# Patient Record
Sex: Female | Born: 1965 | Race: White | Hispanic: No | Marital: Married | Smoking: Former smoker
Health system: Southern US, Community
[De-identification: ages and names within clinical notes are randomized; demographics above are authoritative.]

## PROBLEM LIST (undated history)

## (undated) DIAGNOSIS — E039 Hypothyroidism, unspecified: Secondary | ICD-10-CM

## (undated) DIAGNOSIS — F988 Other specified behavioral and emotional disorders with onset usually occurring in childhood and adolescence: Secondary | ICD-10-CM

## (undated) DIAGNOSIS — C801 Malignant (primary) neoplasm, unspecified: Secondary | ICD-10-CM

## (undated) DIAGNOSIS — N289 Disorder of kidney and ureter, unspecified: Secondary | ICD-10-CM

## (undated) HISTORY — PX: ABDOMINAL HYSTERECTOMY: SHX81

## (undated) HISTORY — PX: ABDOMINOPLASTY: SUR9

## (undated) HISTORY — PX: BREAST LUMPECTOMY: SHX2

---

## 2013-03-02 ENCOUNTER — Observation Stay (HOSPITAL_COMMUNITY): Payer: Managed Care, Other (non HMO)

## 2013-03-02 ENCOUNTER — Observation Stay (HOSPITAL_COMMUNITY): Payer: Managed Care, Other (non HMO) | Admitting: Anesthesiology

## 2013-03-02 ENCOUNTER — Encounter (HOSPITAL_COMMUNITY)
Admission: EM | Disposition: A | Discharge status: Home / Self Care | Payer: Self-pay | Source: Home / Self Care | Attending: Emergency Medicine

## 2013-03-02 ENCOUNTER — Observation Stay (HOSPITAL_COMMUNITY)
Admission: EM | Admit: 2013-03-02 | Discharge: 2013-03-03 | Disposition: A | Discharge status: Home / Self Care | Payer: Managed Care, Other (non HMO) | Attending: General Surgery | Admitting: General Surgery

## 2013-03-02 ENCOUNTER — Emergency Department (HOSPITAL_COMMUNITY): Payer: Managed Care, Other (non HMO)

## 2013-03-02 ENCOUNTER — Encounter (HOSPITAL_COMMUNITY): Payer: Self-pay | Admitting: Anesthesiology

## 2013-03-02 ENCOUNTER — Encounter (HOSPITAL_COMMUNITY): Payer: Self-pay | Admitting: *Deleted

## 2013-03-02 DIAGNOSIS — Z79899 Other long term (current) drug therapy: Secondary | ICD-10-CM | POA: Insufficient documentation

## 2013-03-02 DIAGNOSIS — K352 Acute appendicitis with generalized peritonitis, without abscess: Secondary | ICD-10-CM | POA: Insufficient documentation

## 2013-03-02 DIAGNOSIS — K353 Acute appendicitis with localized peritonitis, without perforation or gangrene: Secondary | ICD-10-CM

## 2013-03-02 DIAGNOSIS — K35209 Acute appendicitis with generalized peritonitis, without abscess, unspecified as to perforation: Principal | ICD-10-CM | POA: Insufficient documentation

## 2013-03-02 DIAGNOSIS — K3580 Unspecified acute appendicitis: Secondary | ICD-10-CM

## 2013-03-02 DIAGNOSIS — C50919 Malignant neoplasm of unspecified site of unspecified female breast: Secondary | ICD-10-CM

## 2013-03-02 DIAGNOSIS — R109 Unspecified abdominal pain: Secondary | ICD-10-CM | POA: Insufficient documentation

## 2013-03-02 HISTORY — DX: Malignant (primary) neoplasm, unspecified: C80.1

## 2013-03-02 HISTORY — PX: LAPAROSCOPIC APPENDECTOMY: SHX408

## 2013-03-02 HISTORY — DX: Disorder of kidney and ureter, unspecified: N28.9

## 2013-03-02 LAB — URINALYSIS, ROUTINE W REFLEX MICROSCOPIC
Bilirubin Urine: NEGATIVE
Glucose, UA: NEGATIVE mg/dL
Hgb urine dipstick: NEGATIVE
Ketones, ur: NEGATIVE mg/dL
Leukocytes, UA: NEGATIVE
Nitrite: NEGATIVE
Protein, ur: NEGATIVE mg/dL
Specific Gravity, Urine: 1.012 (ref 1.005–1.030)
Urobilinogen, UA: 0.2 mg/dL (ref 0.0–1.0)
pH: 7.5 (ref 5.0–8.0)

## 2013-03-02 LAB — CBC WITH DIFFERENTIAL/PLATELET
Basophils Absolute: 0 10*3/uL (ref 0.0–0.1)
Basophils Relative: 0 % (ref 0–1)
HCT: 36.9 % (ref 36.0–46.0)
Lymphocytes Relative: 12 % (ref 12–46)
MCHC: 32.5 g/dL (ref 30.0–36.0)
Neutro Abs: 7.4 10*3/uL (ref 1.7–7.7)
Neutrophils Relative %: 78 % — ABNORMAL HIGH (ref 43–77)
Platelets: 266 10*3/uL (ref 150–400)
RDW: 13.6 % (ref 11.5–15.5)
WBC: 9.5 10*3/uL (ref 4.0–10.5)

## 2013-03-02 LAB — COMPREHENSIVE METABOLIC PANEL
ALT: 11 U/L (ref 0–35)
AST: 16 U/L (ref 0–37)
Albumin: 3.6 g/dL (ref 3.5–5.2)
CO2: 31 mEq/L (ref 19–32)
Chloride: 101 mEq/L (ref 96–112)
Creatinine, Ser: 0.84 mg/dL (ref 0.50–1.10)
GFR calc non Af Amer: 83 mL/min — ABNORMAL LOW (ref >90)
Potassium: 3.4 mEq/L — ABNORMAL LOW (ref 3.5–5.1)
Sodium: 138 mEq/L (ref 135–145)
Total Bilirubin: 0.3 mg/dL (ref 0.3–1.2)

## 2013-03-02 SURGERY — APPENDECTOMY, LAPAROSCOPIC
Anesthesia: General | Wound class: Clean Contaminated

## 2013-03-02 MED ORDER — ACETAMINOPHEN 10 MG/ML IV SOLN
1000.0000 mg | Freq: Four times a day (QID) | INTRAVENOUS | Status: DC
  Administered 2013-03-02: 1000 mg via INTRAVENOUS

## 2013-03-02 MED ORDER — MIDAZOLAM HCL 5 MG/5ML IJ SOLN
INTRAMUSCULAR | Status: DC | PRN
  Administered 2013-03-02 (×3): 1 mg via INTRAVENOUS

## 2013-03-02 MED ORDER — LIDOCAINE HCL (CARDIAC) 20 MG/ML IV SOLN
INTRAVENOUS | Status: DC | PRN
  Administered 2013-03-02: 50 mg via INTRAVENOUS

## 2013-03-02 MED ORDER — FENTANYL CITRATE 0.05 MG/ML IJ SOLN: 25.0000 ug | INTRAMUSCULAR | Status: DC | PRN

## 2013-03-02 MED ORDER — ROCURONIUM BROMIDE 100 MG/10ML IV SOLN
INTRAVENOUS | Status: DC | PRN
  Administered 2013-03-02: 5 mg via INTRAVENOUS
  Administered 2013-03-02 (×2): 15 mg via INTRAVENOUS
  Administered 2013-03-02: 5 mg via INTRAVENOUS

## 2013-03-02 MED ORDER — SUCCINYLCHOLINE CHLORIDE 20 MG/ML IJ SOLN
INTRAMUSCULAR | Status: DC | PRN
  Administered 2013-03-02: 100 mg via INTRAVENOUS

## 2013-03-02 MED ORDER — ACETAMINOPHEN 10 MG/ML IV SOLN
INTRAVENOUS | Status: AC
  Filled 2013-03-02: qty 100

## 2013-03-02 MED ORDER — 0.9 % SODIUM CHLORIDE (POUR BTL) OPTIME
TOPICAL | Status: DC | PRN
  Administered 2013-03-02: 1000 mL

## 2013-03-02 MED ORDER — FENTANYL CITRATE 0.05 MG/ML IJ SOLN
INTRAMUSCULAR | Status: DC | PRN
  Administered 2013-03-02: 100 ug via INTRAVENOUS
  Administered 2013-03-02 (×3): 50 ug via INTRAVENOUS

## 2013-03-02 MED ORDER — HYDROMORPHONE HCL PF 1 MG/ML IJ SOLN
0.5000 mg | Freq: Once | INTRAMUSCULAR | Status: AC
  Administered 2013-03-02: 0.5 mg via INTRAVENOUS
  Filled 2013-03-02: qty 1

## 2013-03-02 MED ORDER — LACTATED RINGERS IV SOLN: INTRAVENOUS | Status: DC

## 2013-03-02 MED ORDER — HYDROCODONE-ACETAMINOPHEN 5-325 MG PO TABS
1.0000 | ORAL_TABLET | ORAL | Status: DC | PRN
  Administered 2013-03-03 (×2): 1 via ORAL
  Filled 2013-03-02 (×2): qty 1

## 2013-03-02 MED ORDER — GLYCOPYRROLATE 0.2 MG/ML IJ SOLN
INTRAMUSCULAR | Status: DC | PRN
  Administered 2013-03-02: 0.3 mg via INTRAVENOUS
  Administered 2013-03-02: 0.1 mg via INTRAVENOUS

## 2013-03-02 MED ORDER — AMPHETAMINE-DEXTROAMPHETAMINE 10 MG PO TABS: 15.0000 mg | ORAL_TABLET | Freq: Two times a day (BID) | ORAL | Status: DC

## 2013-03-02 MED ORDER — LIOTHYRONINE SODIUM 5 MCG PO TABS
5.0000 ug | ORAL_TABLET | Freq: Every day | ORAL | Status: DC
  Filled 2013-03-02: qty 1

## 2013-03-02 MED ORDER — OMEGA-3-ACID ETHYL ESTERS 1 G PO CAPS
2.0000 g | ORAL_CAPSULE | Freq: Every day | ORAL | Status: DC
  Filled 2013-03-02: qty 2

## 2013-03-02 MED ORDER — ONDANSETRON HCL 4 MG/2ML IJ SOLN
INTRAMUSCULAR | Status: DC | PRN
  Administered 2013-03-02: 4 mg via INTRAVENOUS

## 2013-03-02 MED ORDER — MORPHINE SULFATE 2 MG/ML IJ SOLN: 1.0000 mg | INTRAMUSCULAR | Status: DC | PRN

## 2013-03-02 MED ORDER — HYDROMORPHONE HCL PF 1 MG/ML IJ SOLN
1.0000 mg | INTRAMUSCULAR | Status: DC | PRN
  Filled 2013-03-02: qty 1

## 2013-03-02 MED ORDER — POTASSIUM CHLORIDE IN NACL 20-0.9 MEQ/L-% IV SOLN
INTRAVENOUS | Status: DC
  Administered 2013-03-02: 13:00:00 via INTRAVENOUS
  Administered 2013-03-03: 100 mL via INTRAVENOUS
  Filled 2013-03-02 (×3): qty 1000

## 2013-03-02 MED ORDER — IOHEXOL 300 MG/ML  SOLN
80.0000 mL | Freq: Once | INTRAMUSCULAR | Status: AC | PRN
  Administered 2013-03-02: 80 mL via INTRAVENOUS

## 2013-03-02 MED ORDER — HEPARIN SODIUM (PORCINE) 5000 UNIT/ML IJ SOLN
5000.0000 [IU] | Freq: Three times a day (TID) | INTRAMUSCULAR | Status: DC
Start: 2013-03-03 — End: 2013-03-03
  Filled 2013-03-02 (×4): qty 1

## 2013-03-02 MED ORDER — NEOSTIGMINE METHYLSULFATE 1 MG/ML IJ SOLN
INTRAMUSCULAR | Status: DC | PRN
  Administered 2013-03-02: 2 mg via INTRAVENOUS
  Administered 2013-03-02: 1 mg via INTRAVENOUS

## 2013-03-02 MED ORDER — POTASSIUM CHLORIDE IN NACL 20-0.9 MEQ/L-% IV SOLN
INTRAVENOUS | Status: AC
  Filled 2013-03-02: qty 1000

## 2013-03-02 MED ORDER — ONDANSETRON HCL 4 MG/2ML IJ SOLN
4.0000 mg | Freq: Four times a day (QID) | INTRAMUSCULAR | Status: DC | PRN
  Administered 2013-03-02: 4 mg via INTRAVENOUS
  Filled 2013-03-02: qty 2

## 2013-03-02 MED ORDER — SUMATRIPTAN SUCCINATE 100 MG PO TABS
100.0000 mg | ORAL_TABLET | ORAL | Status: DC | PRN
  Administered 2013-03-02: 100 mg via ORAL
  Filled 2013-03-02 (×2): qty 1

## 2013-03-02 MED ORDER — BUPIVACAINE-EPINEPHRINE 0.25% -1:200000 IJ SOLN
INTRAMUSCULAR | Status: DC | PRN
  Administered 2013-03-02: 20 mL

## 2013-03-02 MED ORDER — PROPOFOL 10 MG/ML IV BOLUS
INTRAVENOUS | Status: DC | PRN
  Administered 2013-03-02: 150 mg via INTRAVENOUS

## 2013-03-02 MED ORDER — DEXAMETHASONE SODIUM PHOSPHATE 10 MG/ML IJ SOLN
INTRAMUSCULAR | Status: DC | PRN
  Administered 2013-03-02: 4 mg via INTRAVENOUS

## 2013-03-02 MED ORDER — ONDANSETRON HCL 4 MG/2ML IJ SOLN
4.0000 mg | Freq: Once | INTRAMUSCULAR | Status: AC
  Administered 2013-03-02: 4 mg via INTRAVENOUS
  Filled 2013-03-02: qty 2

## 2013-03-02 MED ORDER — DEXTROSE 5 % IV SOLN
2.0000 g | Freq: Once | INTRAVENOUS | Status: AC
  Administered 2013-03-02: 2 g via INTRAVENOUS
  Filled 2013-03-02: qty 2

## 2013-03-02 MED ORDER — LACTATED RINGERS IR SOLN
Status: DC | PRN
  Administered 2013-03-02: 1000 mL

## 2013-03-02 MED ORDER — ONDANSETRON HCL 4 MG PO TABS: 4.0000 mg | ORAL_TABLET | Freq: Four times a day (QID) | ORAL | Status: DC | PRN

## 2013-03-02 MED ORDER — ONDANSETRON HCL 4 MG/2ML IJ SOLN
4.0000 mg | INTRAMUSCULAR | Status: DC | PRN
  Administered 2013-03-02: 4 mg via INTRAVENOUS

## 2013-03-02 MED ORDER — METOCLOPRAMIDE HCL 5 MG/ML IJ SOLN
INTRAMUSCULAR | Status: DC | PRN
  Administered 2013-03-02: 10 mg via INTRAVENOUS

## 2013-03-02 MED ORDER — PHENYLEPHRINE HCL 10 MG/ML IJ SOLN
INTRAMUSCULAR | Status: DC | PRN
  Administered 2013-03-02: 80 ug via INTRAVENOUS

## 2013-03-02 MED ORDER — LACTATED RINGERS IV SOLN
INTRAVENOUS | Status: DC
  Administered 2013-03-02: 11:00:00 via INTRAVENOUS
  Administered 2013-03-02: 1000 mL via INTRAVENOUS

## 2013-03-02 MED ORDER — BUPIVACAINE-EPINEPHRINE PF 0.25-1:200000 % IJ SOLN
INTRAMUSCULAR | Status: AC
  Filled 2013-03-02: qty 30

## 2013-03-02 MED ORDER — IOHEXOL 300 MG/ML  SOLN
50.0000 mL | Freq: Once | INTRAMUSCULAR | Status: AC | PRN
  Administered 2013-03-02: 50 mL via ORAL

## 2013-03-02 MED ORDER — DEXTROSE 5 % IV SOLN
1.0000 g | Freq: Four times a day (QID) | INTRAVENOUS | Status: AC
  Administered 2013-03-02 – 2013-03-03 (×3): 1 g via INTRAVENOUS
  Filled 2013-03-02 (×4): qty 1

## 2013-03-02 SURGICAL SUPPLY — 35 items
APPLIER CLIP ROT 10 11.4 M/L (STAPLE)
BENZOIN TINCTURE PRP APPL 2/3 (GAUZE/BANDAGES/DRESSINGS) ×2 IMPLANT
CANISTER SUCTION 2500CC (MISCELLANEOUS) ×2 IMPLANT
CLIP APPLIE ROT 10 11.4 M/L (STAPLE) IMPLANT
CLOTH BEACON ORANGE TIMEOUT ST (SAFETY) ×2 IMPLANT
CUTTER FLEX LINEAR 45M (STAPLE) IMPLANT
DECANTER SPIKE VIAL GLASS SM (MISCELLANEOUS) ×2 IMPLANT
DRAPE LAPAROSCOPIC ABDOMINAL (DRAPES) ×2 IMPLANT
ELECT REM PT RETURN 9FT ADLT (ELECTROSURGICAL) ×2
ELECTRODE REM PT RTRN 9FT ADLT (ELECTROSURGICAL) ×1 IMPLANT
ENDOLOOP SUT PDS II  0 18 (SUTURE)
ENDOLOOP SUT PDS II 0 18 (SUTURE) IMPLANT
GLOVE BIOGEL PI IND STRL 7.0 (GLOVE) ×1 IMPLANT
GLOVE BIOGEL PI INDICATOR 7.0 (GLOVE) ×1
GLOVE EUDERMIC 7 POWDERFREE (GLOVE) ×2 IMPLANT
GOWN STRL NON-REIN LRG LVL3 (GOWN DISPOSABLE) ×2 IMPLANT
GOWN STRL REIN XL XLG (GOWN DISPOSABLE) ×4 IMPLANT
HAND ACTIVATED (MISCELLANEOUS) IMPLANT
KIT BASIN OR (CUSTOM PROCEDURE TRAY) ×2 IMPLANT
PENCIL BUTTON HOLSTER BLD 10FT (ELECTRODE) ×2 IMPLANT
POUCH SPECIMEN RETRIEVAL 10MM (ENDOMECHANICALS) ×2 IMPLANT
RELOAD 45 VASCULAR/THIN (ENDOMECHANICALS) ×2 IMPLANT
RELOAD STAPLE TA45 3.5 REG BLU (ENDOMECHANICALS) IMPLANT
SET IRRIG TUBING LAPAROSCOPIC (IRRIGATION / IRRIGATOR) ×2 IMPLANT
SOLUTION ANTI FOG 6CC (MISCELLANEOUS) ×2 IMPLANT
STRIP CLOSURE SKIN 1/2X4 (GAUZE/BANDAGES/DRESSINGS) ×2 IMPLANT
SUT MNCRL AB 4-0 PS2 18 (SUTURE) ×2 IMPLANT
SUT VICRYL 0 UR6 27IN ABS (SUTURE) ×2 IMPLANT
TOWEL OR 17X26 10 PK STRL BLUE (TOWEL DISPOSABLE) ×2 IMPLANT
TRAY FOLEY CATH 14FRSI W/METER (CATHETERS) ×2 IMPLANT
TRAY LAP CHOLE (CUSTOM PROCEDURE TRAY) ×2 IMPLANT
TROCAR BLADELESS OPT 5 75 (ENDOMECHANICALS) ×4 IMPLANT
TROCAR XCEL 12X100 BLDLESS (ENDOMECHANICALS) ×2 IMPLANT
TROCAR XCEL BLUNT TIP 100MML (ENDOMECHANICALS) ×2 IMPLANT
TUBING INSUFFLATION 10FT LAP (TUBING) ×2 IMPLANT

## 2013-03-02 NOTE — Anesthesia Preprocedure Evaluation (Addendum)
Anesthesia Evaluation  Patient identified by MRN, date of birth, ID band Patient awake    Reviewed: Allergy & Precautions, H&P , NPO status , Patient's Chart, lab work & pertinent test results  Airway Mallampati: II TM Distance: >3 FB Neck ROM: full    Dental no notable dental hx. (+) Teeth Intact and Dental Advisory Given   Pulmonary neg pulmonary ROS,  breath sounds clear to auscultation  Pulmonary exam normal       Cardiovascular Exercise Tolerance: Good negative cardio ROS  Rhythm:regular Rate:Normal     Neuro/Psych negative neurological ROS  negative psych ROS   GI/Hepatic negative GI ROS, Neg liver ROS,   Endo/Other  negative endocrine ROS  Renal/GU negative Renal ROS  negative genitourinary   Musculoskeletal   Abdominal   Peds  Hematology negative hematology ROS (+)   Anesthesia Other Findings   Reproductive/Obstetrics negative OB ROS                          Anesthesia Physical Anesthesia Plan  ASA: I and emergent  Anesthesia Plan: General   Post-op Pain Management:    Induction: Intravenous, Rapid sequence and Cricoid pressure planned  Airway Management Planned: Oral ETT  Additional Equipment:   Intra-op Plan:   Post-operative Plan: Extubation in OR  Informed Consent: I have reviewed the patients History and Physical, chart, labs and discussed the procedure including the risks, benefits and alternatives for the proposed anesthesia with the patient or authorized representative who has indicated his/her understanding and acceptance.   Dental Advisory Given  Plan Discussed with: CRNA  Anesthesia Plan Comments:         Anesthesia Quick Evaluation

## 2013-03-02 NOTE — ED Provider Notes (Signed)
History     CSN: [id number]  Arrival date & time 03/02/13  4098   First MD Initiated Contact with Patient 03/02/13 0258      Chief Complaint  Patient presents with  . Abdominal Pain   HPI Dali Kraner is a 47 y.o. female history of breast cancer previously on tamoxifen, she took herself off of that secondary to bladder pain.  Patient arrives today with about 2 days history of worsening abdominal pain. Patient's pain started as a "upset stomach" and she gestures to the Center of the abdomen, periumbilical region, and is now migrated to the right lower quadrant, it is sharp, worse on movement and bumps, associated nausea without vomiting. Patient has eaten today. No anorexia. No diarrhea. No dysuria, frequency, no vaginal discharge, no fevers, chills, chest pain, shortness of breath. Patient's pain is currently 9/10.  Past Medical History  Diagnosis Date  . Cancer     Breast  . Renal disorder     kidney stones, bladder problems    Past Surgical History  Procedure Laterality Date  . Breast lumpectomy      left breast  . Cesarean section    . Abdominal hysterectomy      No family history on file.  History  Substance Use Topics  . Smoking status: Former Games developer  . Smokeless tobacco: Not on file  . Alcohol Use: Yes     Comment: socially    OB History   Grav Para Term Preterm Abortions TAB SAB Ect Mult Living                  Review of Systems At least 10pt or greater review of systems completed and are negative except where specified in the HPI.  Allergies  Review of patient's allergies indicates no known allergies.  Home Medications   Current Outpatient Rx  Name  Route  Sig  Dispense  Refill  . amphetamine-dextroamphetamine (ADDERALL) 15 MG tablet   Oral   Take 15 mg by mouth 2 (two) times daily.         Marland Kitchen anastrozole (ARIMIDEX) 1 MG tablet   Oral   Take 1 mg by mouth daily.         . calcium carbonate (OS-CAL) 600 MG TABS   Oral   Take 1,200 mg by mouth  daily.         Marland Kitchen liothyronine (CYTOMEL) 5 MCG tablet   Oral   Take 5 mcg by mouth daily.         Marland Kitchen omega-3 acid ethyl esters (LOVAZA) 1 G capsule   Oral   Take 2 g by mouth daily.           BP 117/85  Pulse 90  Temp(Src) 98.9 F (37.2 C) (Oral)  Wt 109 lb 6 oz (49.612 kg)  SpO2 100%  Physical Exam  Nursing notes reviewed.  Electronic medical record reviewed. VITAL SIGNS:   Filed Vitals:   03/02/13 0240  BP: 117/85  Pulse: 90  Temp: 98.9 F (37.2 C)  TempSrc: Oral  Weight: 109 lb 6 oz (49.612 kg)  SpO2: 100%   CONSTITUTIONAL: Awake, oriented, appears non-toxic HENT: Atraumatic, normocephalic, oral mucosa pink and moist, airway patent. Nares patent without drainage. External ears normal. EYES: Conjunctiva clear, EOMI, PERRLA NECK: Trachea midline, non-tender, supple CARDIOVASCULAR: Normal heart rate, Normal rhythm, No murmurs, rubs, gallops PULMONARY/CHEST: Clear to auscultation, no rhonchi, wheezes, or rales. Symmetrical breath sounds. Non-tender. ABDOMINAL: Non-distended, soft, tenderness at McBurney's point with  voluntary guarding, & rebound. Patient has tenderness over the suprapubic region as well.  BS normal. Well-healed surgical scars of the abdomen. NEUROLOGIC: Non-focal, moving all four extremities, no gross sensory or motor deficits. EXTREMITIES: No clubbing, cyanosis, or edema SKIN: Warm, Dry, No erythema, No rash  ED Course  Procedures (including critical care time)  Labs Reviewed  CBC WITH DIFFERENTIAL - Abnormal; Notable for the following:    Neutrophils Relative 78 (*)    All other components within normal limits  COMPREHENSIVE METABOLIC PANEL - Abnormal; Notable for the following:    Potassium 3.4 (*)    GFR calc non Af Amer 83 (*)    All other components within normal limits  LIPASE, BLOOD  URINALYSIS, ROUTINE W REFLEX MICROSCOPIC   Ct Abdomen Pelvis W Contrast  03/02/2013  *RADIOLOGY REPORT*  Clinical Data: 47 year old female with  increasing lower abdominal pain and nausea.  History of kidney stones and bladder problems. History of breast cancer status post radiation and chemotherapy completed in 2013.  CT ABDOMEN AND PELVIS WITH CONTRAST  Technique:  Multidetector CT imaging of the abdomen and pelvis was performed following the standard protocol during bolus administration of intravenous contrast.  Contrast: 80mL OMNIPAQUE IOHEXOL 300 MG/ML  SOLN  Comparison: None.  Findings: Lung bases are clear except for minimal atelectasis.  No pericardial or pleural effusion.  No acute or suspicious osseous lesion identified.  Uterus is surgically absent.  No definite pelvic free fluid. Adnexa not delineated.  Mostly decompressed distal colon.  Some retained stool mixed with contrast or other dense enteric contents in the distal colon.  Left colon within normal limits.  Negative transverse colon except for some redundancy.  Retained stool the right colon and cecum.  The cecum is mostly located in the pelvis.  The appendix arises from the posterior cecum and is directed into the posterior right hemi pelvis.  There is mural thickening and enhancement.  There is hyperdense material in the appendix suspicious for appendicoliths. There is fluid in the appendix.  The appendix measures up to 11 mm diameter (8 mm near the tip).  Oral contrast has not yet reached the ileum.  There is some flocculated material in the distal small bowel.  No dilated small bowel.  Stomach is distended with contrast.  Duodenum is mildly distended.  No liver lesion identified.  Gallbladder, spleen, pancreas, adrenal glands, and portal venous system are within normal limits.  Major arterial structures in the abdomen and pelvis are patent with mild atherosclerosis.  No abdominal free fluid.  Punctate nonobstructing right renal lower pole calculus.  There may be a punctate left lower pole calculus.  Kidneys otherwise are within normal limits. No lymphadenopathy in the abdomen or pelvis.   IMPRESSION: 1.  Positive for acute appendicitis.  Dilated and hyperenhancing appendix extending into the posterior right hemi pelvis with evidence of appendicoliths in the lumen. 2.  No definite free fluid.  No abscess. 3.  No metastatic process identified in the abdomen or pelvis.   Original Report Authenticated By: Erskine Speed, M.D.      1. Acute appendicitis with localized peritonitis       MDM  Zelia Yzaguirre is a 47 y.o. female with a history of chronic bladder pain that had resolved when she stopped taking her tamoxifen presents with periumbilical pain has migrated to the right lower quadrant concerning for appendicitis. Labs and CT show a normal white count but the CT quite clearly shows appendicitis. Antibiotics started, discussed with  Dr. Charlean Sanfilippo who is coming on for the day Will evaluate patient at [facility] long for appendectomy.  Patient is stable for admission for acute appendicitis        Jones Skene, MD 03/02/13 0700

## 2013-03-02 NOTE — Interval H&P Note (Signed)
History and Physical Interval Note:  03/02/2013 9:21 AM  Sheila Whitney  has presented today for surgery, with the diagnosis of appendicitis  The goals and the various methods of treatment have been discussed with the patient and family. After consideration of risks, benefits and other options for treatment, the patient has consented to  Procedure(s): APPENDECTOMY LAPAROSCOPIC possible open appendectomy (N/A) as a surgical intervention .  The patient's history has been reviewed, patient examined, no change in status, stable for surgery.  I have reviewed the patient's chart and labs.  Questions were answered to the patient's satisfaction.     Ernestene Mention

## 2013-03-02 NOTE — H&P (Signed)
General surgery attending note:  I have personally interviewed and examined this patient this morning. I have reviewed her lab work and CT scan. I agree with the assessment and treatment plan outlined by Ms. Earl Gala, [place].  This patient has had abdominal pain for 24 hours, initially centralized now right lower quadrant. She's been nauseated but has not vomited. She has a history of endometriosis and has had an abdominal hysterectomy. She has also had an abdominoplasty. Previously she has had a cesarean section. She also has been treated for stage II breast cancer in [place] and is currently on antiestrogen therapy.  Examination reveals tenderness and guarding in the right lower quadrant. Has a little bit of fullness in this area but no mass. All of her scars appear well healed and there are no hernias detected.  Assessment: Acute appendicitis History cesarean section History abdominal hysterectomy  history abdominoplasty History stage II breast cancer, one year following initial diagnosis and treatment  Plan: The patient to the operating room for a laparoscopic appendectomy, possible open appendectomy.  I discussed the indications, details, techniques, and numerous risks of the surgery with her. She understands all these issues. All questions are answered. She agrees with this plan.   Angelia Mould. Derrell Lolling, M.D., [facility] General and Minimally invasive Surgery Breast and Colorectal Surgery Office:   [phone] Pager:   [phone]

## 2013-03-02 NOTE — Op Note (Signed)
Patient Name:           Sheila Whitney   Date of Surgery:        03/02/2013  Pre op Diagnosis:      Acute appendicitis  Post op Diagnosis:    Acute appendicitis, partially retrocecal  Procedure:                 Laparoscopic appendectomy  Surgeon:                     Angelia Mould. Derrell Lolling, M.D., FACS  Assistant:                      None  Operative Indications:   This patient has had abdominal pain for 24 hours, initially centralized now right lower quadrant. She's been nauseated but has not vomited. She has a history of endometriosis and has had an abdominal hysterectomy. She has also had an abdominoplasty. Previously she has had a cesarean section. She also has been treated for stage II breast cancer in [place] and is currently on antiestrogen therapy.  Examination reveals tenderness and guarding in the right lower quadrant. Has a little bit of fullness in this area but no mass. All of her scars appear well healed and there are no hernias detected. CT scan shows acute appendicitis with the appendix in the posterior right pelvis. There is no evidence of rupture or abscess.  Operative Findings:       The appendix was acutely inflamed but not ruptured. There was minimal exudate. The body and tip of the appendix were free in the pelvic peritoneal cavity, but the base of the appendix was somewhat retrocecal requiring a little bit of mobilization of the cecum from lateral to medial. The terminal ileum could be identified at. The ileocecal valve area could be identified. The liver looked healthy. The rest of the small bowel and colon looked normal.  Procedure in Detail:          Following the induction of general endotracheal anesthesia, a Foley catheter was inserted, the abdomen was prepped and draped in a sterile fashion, and intravenous antibiotics were completely infused. Surgical time out was performed. 0.5% Marcaine with epinephrine was used as a local infiltration anesthetic.      I made a curved  incision at the superior rim of the umbilicus, through a previous scar. A section was carried down to the fascia above the umbilicus. There were numerous permanent sutures there. I slowly divided the fascia in this location but found that the preperitoneal tissues were very thickened and so I became reluctant to forcibly puncture through this. I placed a 5 mm optical trocar in the left subcostal region. This was uneventful. Pneumoperitoneum was created. I could visualize under the umbilicus and there were no adhesions. I then completed the dissection through the supraumbilical fascia into the abdominal cavity and placed an 11 mm Hassan trocar. I secured this with 0 Vicryl sutures. Ultimately I placed a 5 mm trocar in the left lower quadrant and a 12 mm trocar in the left suprapubic area. I  identified the anatomy of the terminal ileum, ileocecal valve, cecum, and the appendix. We divided some of the lateral peritoneal attachments to mobilize the cecum fully.  We then lifted the appendix up. Using the harmonic scalpel we divided the appendiceal mesentery. We did this in several stages until we had completely mobilized the appendix and could see the insertion of the appendix into the  base of the cecum. Endo GIA stapler was inserted carefully across the base of the cecum taking a small cuff of the cecum. The stapler was closed and fired and removed. The staple line looked excellent without bleeding. There were a couple of loose staples which were retrieved and removed. The appendix is placed in a specimen bag and removed. We irrigated the operative field. There was no bleeding. We checked all the areas of dissection and there appeared to be no problem. We placed the omentum over the staple line. The pneumoperitoneum was released. The trocars were removed. The fascia above the umbilicus was closed with several interrupted sutures of 0 Vicryl. The fascia at the left suprapubic trocar site was closed with 0 Vicryl  sutures. Skin incisions were closed with subcuticular sutures of 4-0 Monocryl Steri-Strips. Clean bandages were placed the patient to return in stable condition. EBL 10 cc. Counts correct. Deno Lunger M. Derrell Lolling, M.D., FACS General and Minimally Invasive Surgery Breast and Colorectal Surgery  03/02/2013 11:57 AM

## 2013-03-02 NOTE — ED Notes (Signed)
Pt states that she began with rt lower quad abd pain that began yesterday and has progressively gotten worse; pt c/o nausea no vomiting.

## 2013-03-02 NOTE — H&P (Signed)
Sheila Whitney 1966/03/30  [id number].   Primary Oncologist: [facility] Chief Complaint/Reason for Consult: abdominal pain, acute appendicitis HPI: This is a 47 yo female who is in [place] for her son-in-law's graduation.  She developed some vague abdominal discomfort yesterday and felt she needed to have a BM which didn't relieve the pain.  She had some nausea, but no emesis.  The pain migrated to the lower portion of her abdomen, and eventually to the RLQ.  She did not check her temperature and denies chills.  Due to progressive pain, she presented to [facility] today for evaluation.  She was found to have acute appendicitis on CT scan.  Of note, she is still taking oral chemo, Arimidex.  Review of Systems: Please see HPI, otherwise all other systems are negative, except for hematuria  History reviewed. No pertinent family history.  Past Medical History  Diagnosis Date  . Cancer     Breast  . Renal disorder     kidney stones, bladder problems    Past Surgical History  Procedure Laterality Date  . Breast lumpectomy      left breast  . Cesarean section    . Abdominal hysterectomy    . Abdominoplasty      Social History:  reports that she has quit smoking. She does not have any smokeless tobacco history on file. She reports that she drinks about 1.2 ounces of alcohol per week. She reports that she does not use illicit drugs.  Allergies: No Known Allergies   (Not in a hospital admission)  Blood pressure 117/85, pulse 90, temperature 98.9 F (37.2 C), temperature source Oral, weight 109 lb 6 oz (49.612 kg), SpO2 100.00%. Physical Exam: General: pleasant, WD, WN white female who is laying in bed in NAD HEENT: head is normocephalic, atraumatic.  Sclera are noninjected.  PERRL.  Ears and nose without any masses or lesions.  Mouth is pink and moist Heart: regular, rate, and rhythm.  Normal s1,s2. No obvious murmurs, gallops, or rubs noted.  Palpable radial and pedal pulses bilaterally Lungs:  CTAB, no wheezes, rhonchi, or rales noted.  Respiratory effort nonlabored Abd: soft, diffusely tender, but greatest in RLQ, ND, +BS, no masses, hernias, or organomegaly.  She has several scars from prior abdominal surgery MS: all 4 extremities are symmetrical with no cyanosis, clubbing, or edema. Skin: warm and dry with no masses, lesions, or rashes Psych: A&Ox3 with an appropriate affect.    Results for orders placed during the hospital encounter of 03/02/13 (from the past 48 hour(s))  CBC WITH DIFFERENTIAL     Status: Abnormal   Collection Time    03/02/13  3:17 AM      Result Value Range   WBC 9.5  4.0 - 10.5 K/uL   RBC 4.12  3.87 - 5.11 MIL/uL   Hemoglobin 12.0  12.0 - 15.0 g/dL   HCT 40.9  81.1 - 91.4 %   MCV 89.6  78.0 - 100.0 fL   MCH 29.1  26.0 - 34.0 pg   MCHC 32.5  30.0 - 36.0 g/dL   RDW 78.2  95.6 - 21.3 %   Platelets 266  150 - 400 K/uL   Neutrophils Relative 78 (*) 43 - 77 %   Neutro Abs 7.4  1.7 - 7.7 K/uL   Lymphocytes Relative 12  12 - 46 %   Lymphs Abs 1.2  0.7 - 4.0 K/uL   Monocytes Relative 8  3 - 12 %   Monocytes Absolute 0.7  0.1 -  1.0 K/uL   Eosinophils Relative 2  0 - 5 %   Eosinophils Absolute 0.2  0.0 - 0.7 K/uL   Basophils Relative 0  0 - 1 %   Basophils Absolute 0.0  0.0 - 0.1 K/uL  COMPREHENSIVE METABOLIC PANEL     Status: Abnormal   Collection Time    03/02/13  3:17 AM      Result Value Range   Sodium 138  135 - 145 mEq/L   Potassium 3.4 (*) 3.5 - 5.1 mEq/L   Chloride 101  96 - 112 mEq/L   CO2 31  19 - 32 mEq/L   Glucose, Bld 96  70 - 99 mg/dL   BUN 14  6 - 23 mg/dL   Creatinine, Ser 4.54  0.50 - 1.10 mg/dL   Calcium 9.3  8.4 - 09.8 mg/dL   Total Protein 6.5  6.0 - 8.3 g/dL   Albumin 3.6  3.5 - 5.2 g/dL   AST 16  0 - 37 U/L   ALT 11  0 - 35 U/L   Alkaline Phosphatase 103  39 - 117 U/L   Total Bilirubin 0.3  0.3 - 1.2 mg/dL   GFR calc non Af Amer 83 (*) >90 mL/min   GFR calc Af Amer >90  >90 mL/min   Comment:            The eGFR has  been calculated     using the CKD EPI equation.     This calculation has not been     validated in all clinical     situations.     eGFR's persistently     <90 mL/min signify     possible Chronic Kidney Disease.  LIPASE, BLOOD     Status: None   Collection Time    03/02/13  3:17 AM      Result Value Range   Lipase 45  11 - 59 U/L  URINALYSIS, ROUTINE W REFLEX MICROSCOPIC     Status: None   Collection Time    03/02/13  4:39 AM      Result Value Range   Color, Urine YELLOW  YELLOW   APPearance CLEAR  CLEAR   Specific Gravity, Urine 1.012  1.005 - 1.030   pH 7.5  5.0 - 8.0   Glucose, UA NEGATIVE  NEGATIVE mg/dL   Hgb urine dipstick NEGATIVE  NEGATIVE   Bilirubin Urine NEGATIVE  NEGATIVE   Ketones, ur NEGATIVE  NEGATIVE mg/dL   Protein, ur NEGATIVE  NEGATIVE mg/dL   Urobilinogen, UA 0.2  0.0 - 1.0 mg/dL   Nitrite NEGATIVE  NEGATIVE   Leukocytes, UA NEGATIVE  NEGATIVE   Comment: MICROSCOPIC NOT DONE ON URINES WITH NEGATIVE PROTEIN, BLOOD, LEUKOCYTES, NITRITE, OR GLUCOSE <1000 mg/dL.   Ct Abdomen Pelvis W Contrast  03/02/2013  *RADIOLOGY REPORT*  Clinical Data: 47 year old female with increasing lower abdominal pain and nausea.  History of kidney stones and bladder problems. History of breast cancer status post radiation and chemotherapy completed in 2013.  CT ABDOMEN AND PELVIS WITH CONTRAST  Technique:  Multidetector CT imaging of the abdomen and pelvis was performed following the standard protocol during bolus administration of intravenous contrast.  Contrast: 80mL OMNIPAQUE IOHEXOL 300 MG/ML  SOLN  Comparison: None.  Findings: Lung bases are clear except for minimal atelectasis.  No pericardial or pleural effusion.  No acute or suspicious osseous lesion identified.  Uterus is surgically absent.  No definite pelvic free fluid. Adnexa not delineated.  Mostly  decompressed distal colon.  Some retained stool mixed with contrast or other dense enteric contents in the distal colon.  Left colon  within normal limits.  Negative transverse colon except for some redundancy.  Retained stool the right colon and cecum.  The cecum is mostly located in the pelvis.  The appendix arises from the posterior cecum and is directed into the posterior right hemi pelvis.  There is mural thickening and enhancement.  There is hyperdense material in the appendix suspicious for appendicoliths. There is fluid in the appendix.  The appendix measures up to 11 mm diameter (8 mm near the tip).  Oral contrast has not yet reached the ileum.  There is some flocculated material in the distal small bowel.  No dilated small bowel.  Stomach is distended with contrast.  Duodenum is mildly distended.  No liver lesion identified.  Gallbladder, spleen, pancreas, adrenal glands, and portal venous system are within normal limits.  Major arterial structures in the abdomen and pelvis are patent with mild atherosclerosis.  No abdominal free fluid.  Punctate nonobstructing right renal lower pole calculus.  There may be a punctate left lower pole calculus.  Kidneys otherwise are within normal limits. No lymphadenopathy in the abdomen or pelvis.  IMPRESSION: 1.  Positive for acute appendicitis.  Dilated and hyperenhancing appendix extending into the posterior right hemi pelvis with evidence of appendicoliths in the lumen. 2.  No definite free fluid.  No abscess. 3.  No metastatic process identified in the abdomen or pelvis.   Original Report Authenticated By: Erskine Speed, M.D.        Assessment/Plan 1. Acute appendicitis 2. Breast cancer, on oral chemo 3. Hematuria, thought secondary to prior tamoxifen use 4. H/o nephrolithiasis  Plan: 1. The patient will be admitted and taken to the operating room for an appendectomy.  i have discussed the procedure including possible risks and complications to her.  These include but are not limited to, bleeding, infection, wound infection, injury to surrounding organs, post op abscess, appendiceal stump  leak, or need for conversion to open procedure.  She is on oral chemo which does put her at higher risk for healing problems.  We discussed this as well.  The patient understands all of this and is willing to proceed.  She will be given a dose of abx therapy.  We discussed her post-operative course and follow up.  All questions were answered.   Sheila Whitney E 03/02/2013, 7:58 AM Pager: [phone]

## 2013-03-02 NOTE — Transfer of Care (Signed)
Immediate Anesthesia Transfer of Care Note  Patient: Sheila Whitney  Procedure(s) Performed: Procedure(s): APPENDECTOMY LAPAROSCOPIC  (N/A)  Patient Location: PACU  Anesthesia Type:General  Level of Consciousness: oriented, sedated and patient cooperative  Airway & Oxygen Therapy: Patient Spontanous Breathing and Patient connected to face mask oxygen  Post-op Assessment: Report given to PACU RN, Post -op Vital signs reviewed and stable and Patient moving all extremities  Post vital signs: Reviewed and stable  Complications: No apparent anesthesia complications

## 2013-03-02 NOTE — Anesthesia Postprocedure Evaluation (Signed)
  Anesthesia Post-op Note  Patient: Sheila Whitney  Procedure(s) Performed: Procedure(s) (LRB): APPENDECTOMY LAPAROSCOPIC  (N/A)  Patient Location: PACU  Anesthesia Type: General  Level of Consciousness: awake and alert   Airway and Oxygen Therapy: Patient Spontanous Breathing  Post-op Pain: mild  Post-op Assessment: Post-op Vital signs reviewed, Patient's Cardiovascular Status Stable, Respiratory Function Stable, Patent Airway and No signs of Nausea or vomiting  Last Vitals:  Filed Vitals:   03/02/13 1230  BP: 122/75  Pulse: 96  Temp:   Resp: 13    Post-op Vital Signs: stable   Complications: No apparent anesthesia complications

## 2013-03-03 MED ORDER — HYDROCODONE-ACETAMINOPHEN 5-325 MG PO TABS: 1.0000 | ORAL_TABLET | ORAL | Status: AC | PRN

## 2013-03-03 NOTE — Progress Notes (Signed)
Utilization Review Completed.   Jawad Wiacek, RN, BSN Nurse Case Manager  [phone]  

## 2013-03-03 NOTE — Discharge Summary (Signed)
Physician Discharge Summary  Patient ID: Sheila Whitney MRN: [id number] DOB/AGE: 06/08/1966 47 y.o.  Admit date: 03/02/2013 Discharge date: 03/03/2013  Admission Diagnoses:  Acute appendicitis  Discharge Diagnoses:  same  Principal Problem:   Acute appendicitis Active Problems:   Breast cancer   Surgery:  Laparoscopic appendectomy  Discharged Condition: improved  Hospital Course:   Had surgery.  Kept overnight.  Doing well and ready to go home  Consults: none  Significant Diagnostic Studies: none    Discharge Exam: Blood pressure 102/63, pulse 77, temperature 98.9 F (37.2 C), temperature source Oral, resp. rate 17, height 5\' 2"  (1.575 m), weight 118 lb 13.3 oz (53.9 kg), SpO2 97.00%. Minimal soreness.    Disposition: Final discharge disposition not confirmed  Discharge Orders   Future Orders Complete By Expires     Diet - low sodium heart healthy  As directed     Discharge instructions  As directed     Comments:      You may followup here in [place] [facility] [phone] or see your PCP    Driving Restrictions  As directed     Comments:      Don't drive if taking narcotic pain meds    Increase activity slowly  As directed     No wound care  As directed         Medication List    TAKE these medications       amphetamine-dextroamphetamine 15 MG tablet  Commonly known as:  ADDERALL  Take 15 mg by mouth 2 (two) times daily.     anastrozole 1 MG tablet  Commonly known as:  ARIMIDEX  Take 1 mg by mouth daily.     calcium carbonate 600 MG Tabs  Commonly known as:  OS-CAL  Take 1,200 mg by mouth daily.     HYDROcodone-acetaminophen 5-325 MG per tablet  Commonly known as:  NORCO/VICODIN  Take 1-2 tablets by mouth every 4 (four) hours as needed.     liothyronine 5 MCG tablet  Commonly known as:  CYTOMEL  Take 5 mcg by mouth daily.     omega-3 acid ethyl esters 1 G capsule  Commonly known as:  LOVAZA  Take 2 g by mouth daily.           Follow-up  Information   Schedule an appointment as soon as possible for a visit with Ernestene Mention, MD.   Contact information:   [address] [place] [address] [phone]       Signed: Valarie Merino 03/03/2013, 8:02 AM

## 2013-03-03 NOTE — Progress Notes (Signed)
Assessment unchanged. Pt and husband verbalized understanding of dc instructions and script x 1 given as provided by MD. Pt verbalized understanding of follow up care. Introduced to My Chart. Discharged via wc to front entrance to meet awaiting vehicle to carry home. Accompanied by husband and NT.

## 2013-03-05 ENCOUNTER — Encounter (HOSPITAL_COMMUNITY): Payer: Self-pay | Admitting: General Surgery

## 2013-10-24 IMAGING — CT CT ABD-PELV W/ CM
1 of 3 series · 13 of 32 positions shown, 18 images · IV contrast (80 ML OMNI 300)
Comparison: None.

CLINICAL DATA: 47-year-old female with increasing lower abdominal
pain and nausea.  History of kidney stones and bladder problems.
History of breast cancer status post radiation and chemotherapy
completed in 7316.

CT ABDOMEN AND PELVIS WITH CONTRAST
TECHNIQUE: Multidetector CT imaging of the abdomen and pelvis was
performed following the standard protocol during bolus
administration of intravenous contrast.
Contrast: 80mL OMNIPAQUE IOHEXOL 300 MG/ML  SOLN

[Series 2: abd/pel with · axial · 0.60mm/px · z∈[+1204,+1544]mm · 13 of 76 slices shown, 18 images]
[im 4/76  soft-tissue]
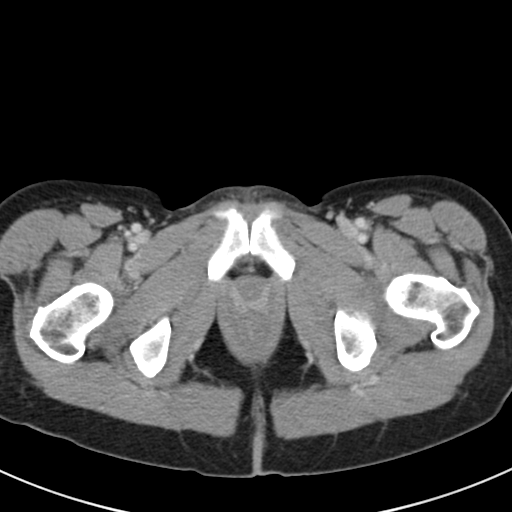
[im 4/76  bone]
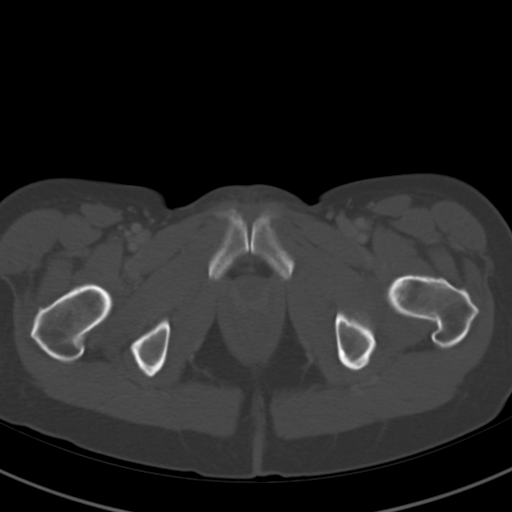
[im 12/76  soft-tissue]
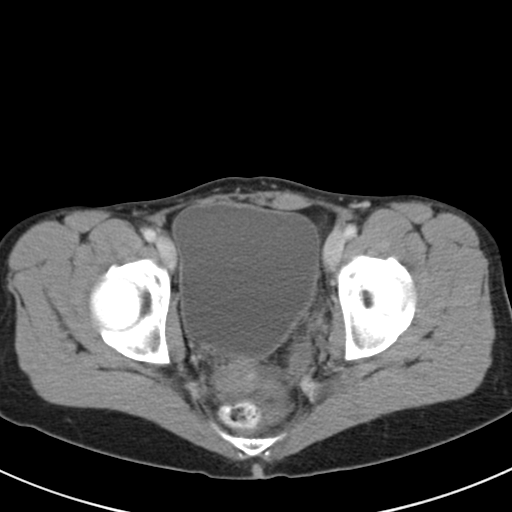
[im 16/76  soft-tissue]
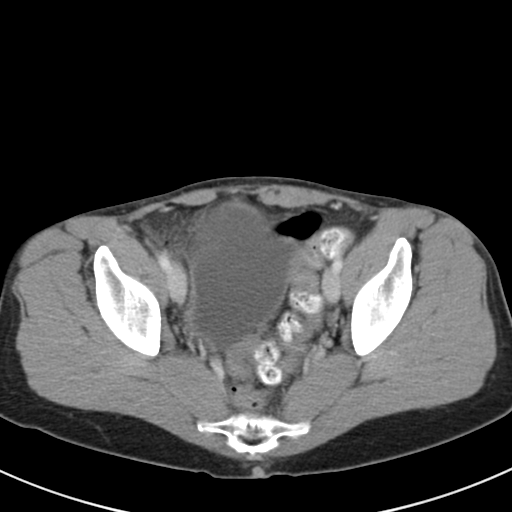
[im 24/76  soft-tissue]
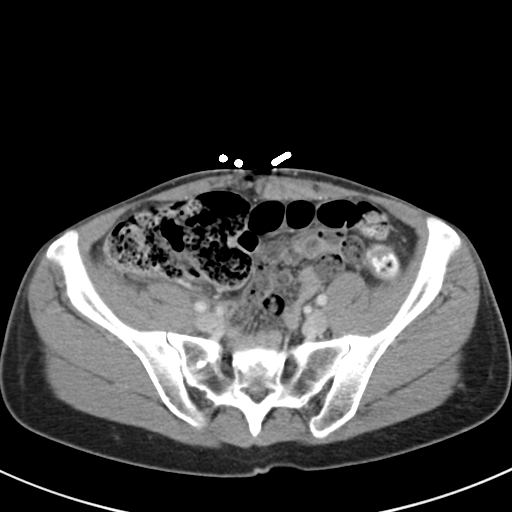
[im 28/76  soft-tissue]
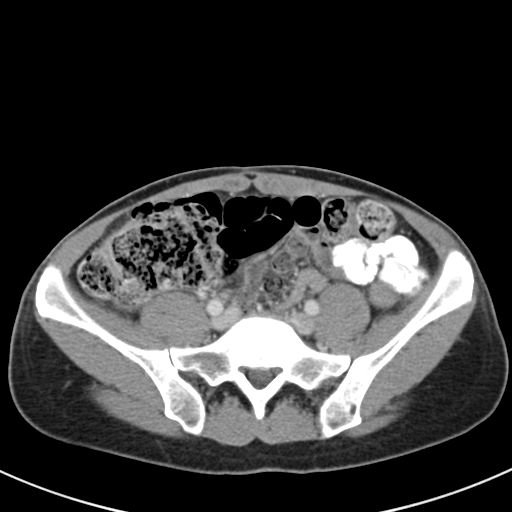
[im 36/76  soft-tissue]
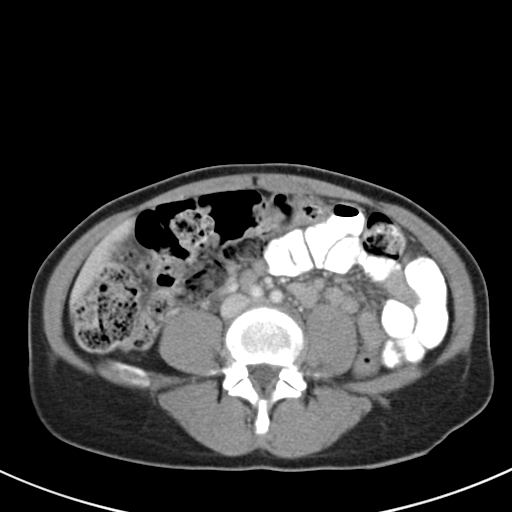
[im 40/76  soft-tissue]
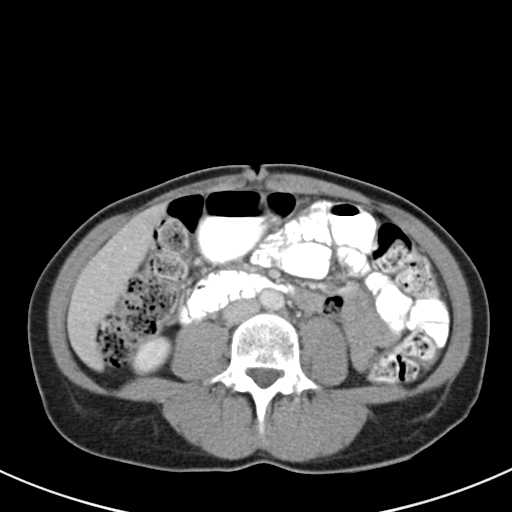
[im 48/76  soft-tissue]
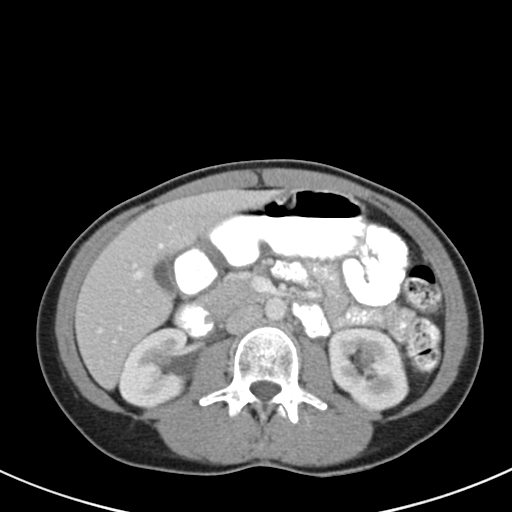
[im 52/76  soft-tissue]
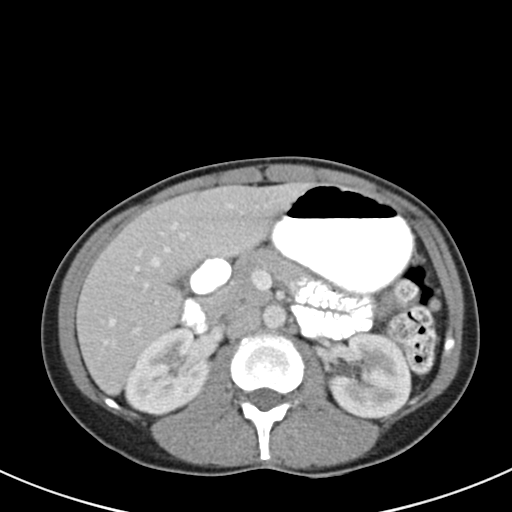
[im 52/76  bone]
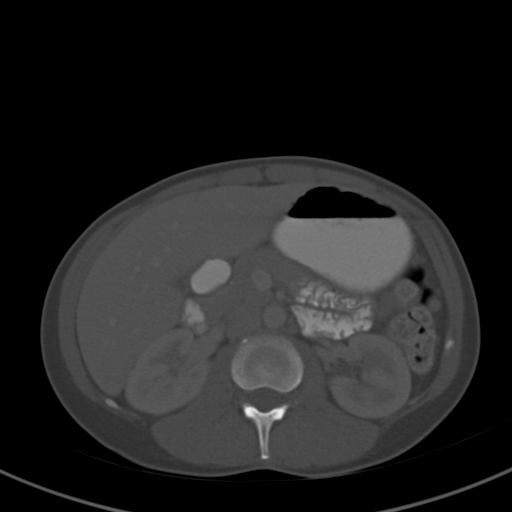
[im 60/76  soft-tissue]
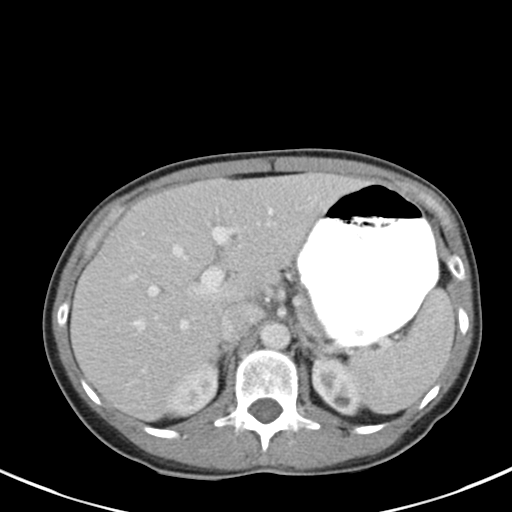
[im 60/76  lung]
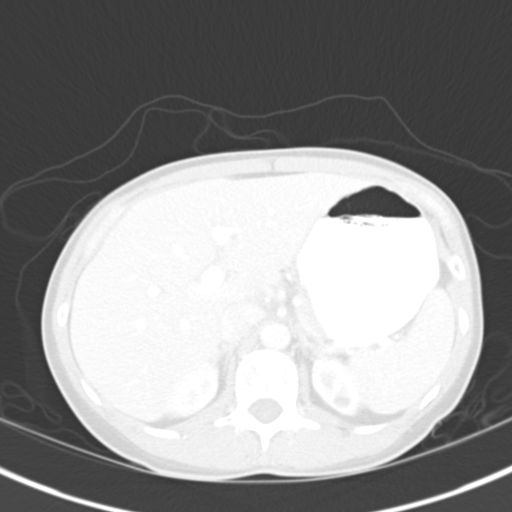
[im 64/76  soft-tissue]
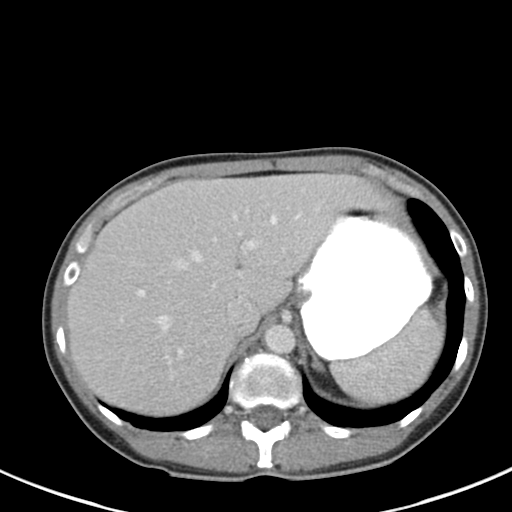
[im 64/76  lung]
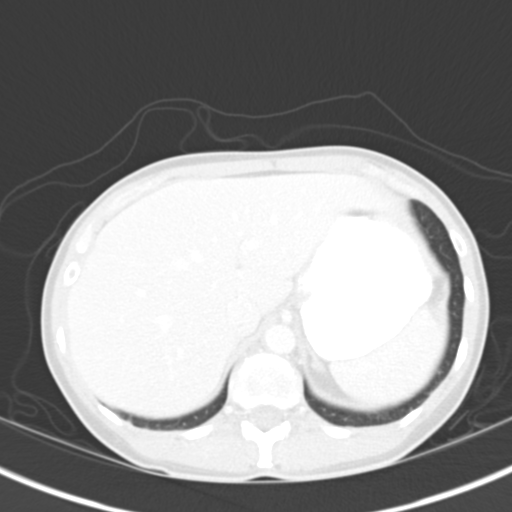
[im 68/76  lung]
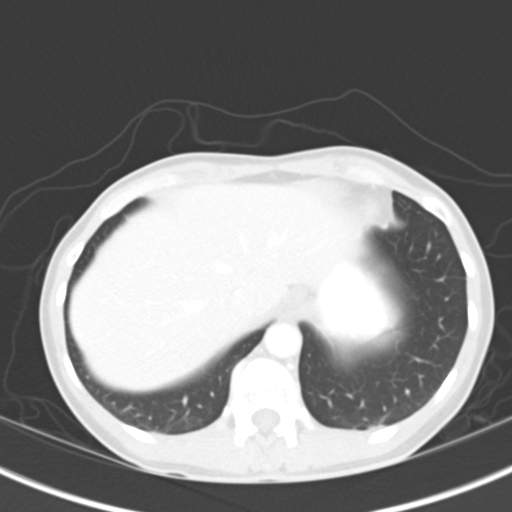
[im 72/76  soft-tissue]
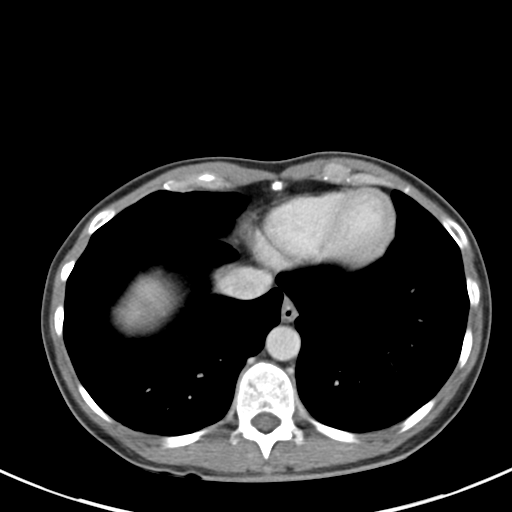
[im 72/76  lung]
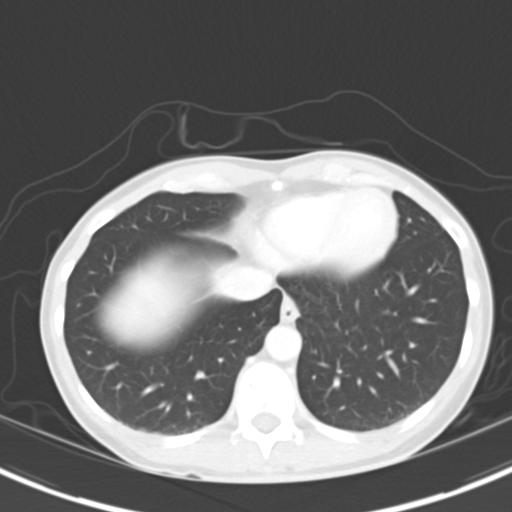

[13 of 32 positions shown; findings below may reference images not displayed]

FINDINGS: Lung bases are clear except for minimal atelectasis.  No
pericardial or pleural effusion.  No acute or suspicious osseous
lesion identified.

Uterus is surgically absent.  No definite pelvic free fluid.
Adnexa not delineated.

Mostly decompressed distal colon.  Some retained stool mixed with
contrast or other dense enteric contents in the distal colon.  Left
colon within normal limits.  Negative transverse colon except for
some redundancy.  Retained stool the right colon and cecum.

The cecum is mostly located in the pelvis.  The appendix arises
from the posterior cecum and is directed into the posterior right
hemi pelvis.  There is mural thickening and enhancement.  There is
hyperdense material in the appendix suspicious for appendicoliths.
There is fluid in the appendix.  The appendix measures up to 11 mm
diameter (8 mm near the tip).

Oral contrast has not yet reached the ileum.  There is some
flocculated material in the distal small bowel.  No dilated small
bowel.  Stomach is distended with contrast.  Duodenum is mildly
distended.

No liver lesion identified.  Gallbladder, spleen, pancreas, adrenal
glands, and portal venous system are within normal limits.  Major
arterial structures in the abdomen and pelvis are patent with mild
atherosclerosis.  No abdominal free fluid.  Punctate nonobstructing
right renal lower pole calculus.  There may be a punctate left
lower pole calculus.  Kidneys otherwise are within normal limits.
No lymphadenopathy in the abdomen or pelvis.
IMPRESSION: 1.  Positive for acute appendicitis.  Dilated and hyperenhancing
appendix extending into the posterior right hemi pelvis with
evidence of appendicoliths in the lumen.
2.  No definite free fluid.  No abscess.
3.  No metastatic process identified in the abdomen or pelvis.

## 2013-10-24 IMAGING — CR DG ABD PORTABLE 1V
1 series · 2 of 2 positions shown · non-contrast
Comparison: CT 03/02/2013.

CLINICAL DATA: Incorrect sponge count.  Evaluate for retained
radiopaque foreign body.

PORTABLE ABDOMEN - 1 VIEW

[Series 1: ap (kub) · U · 2 of 2 slices shown]
[im 1/2]
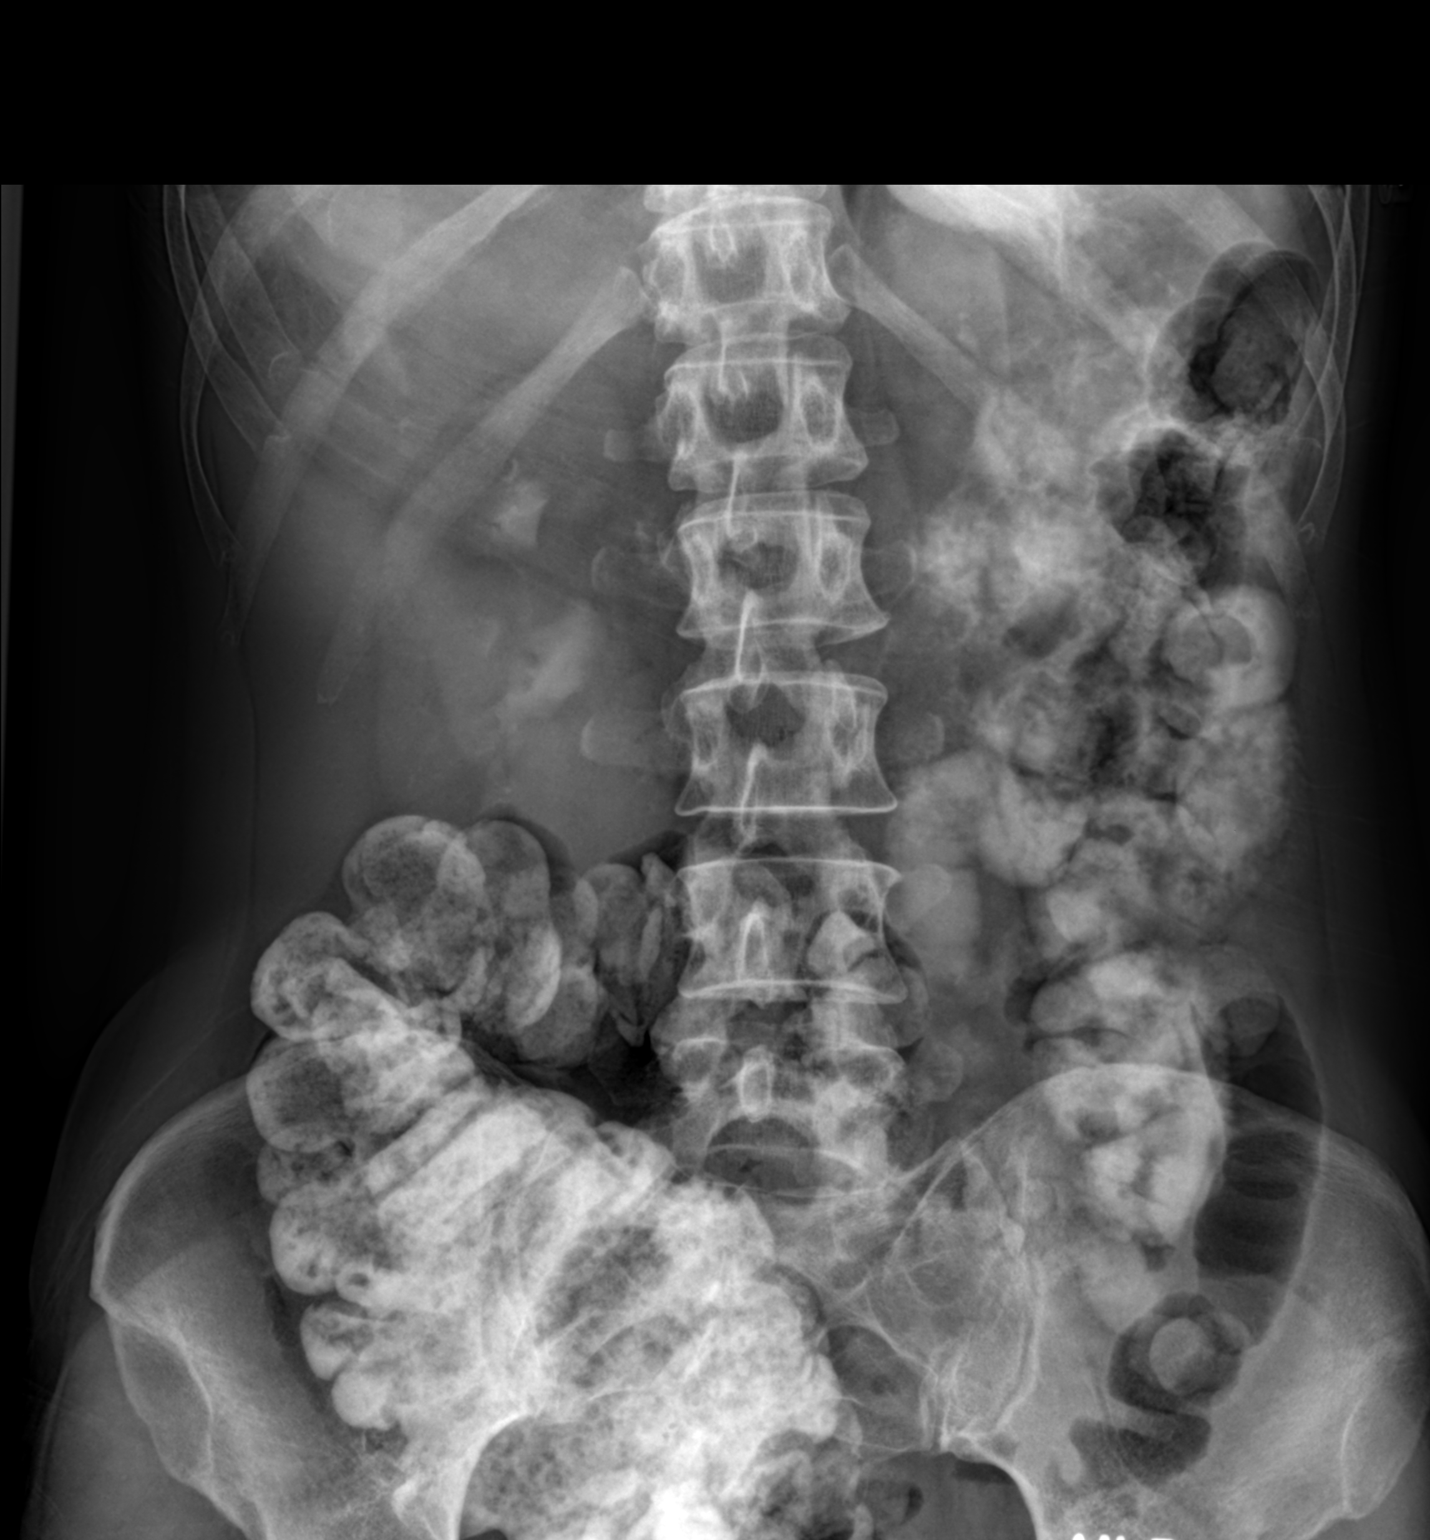
[im 2/2]
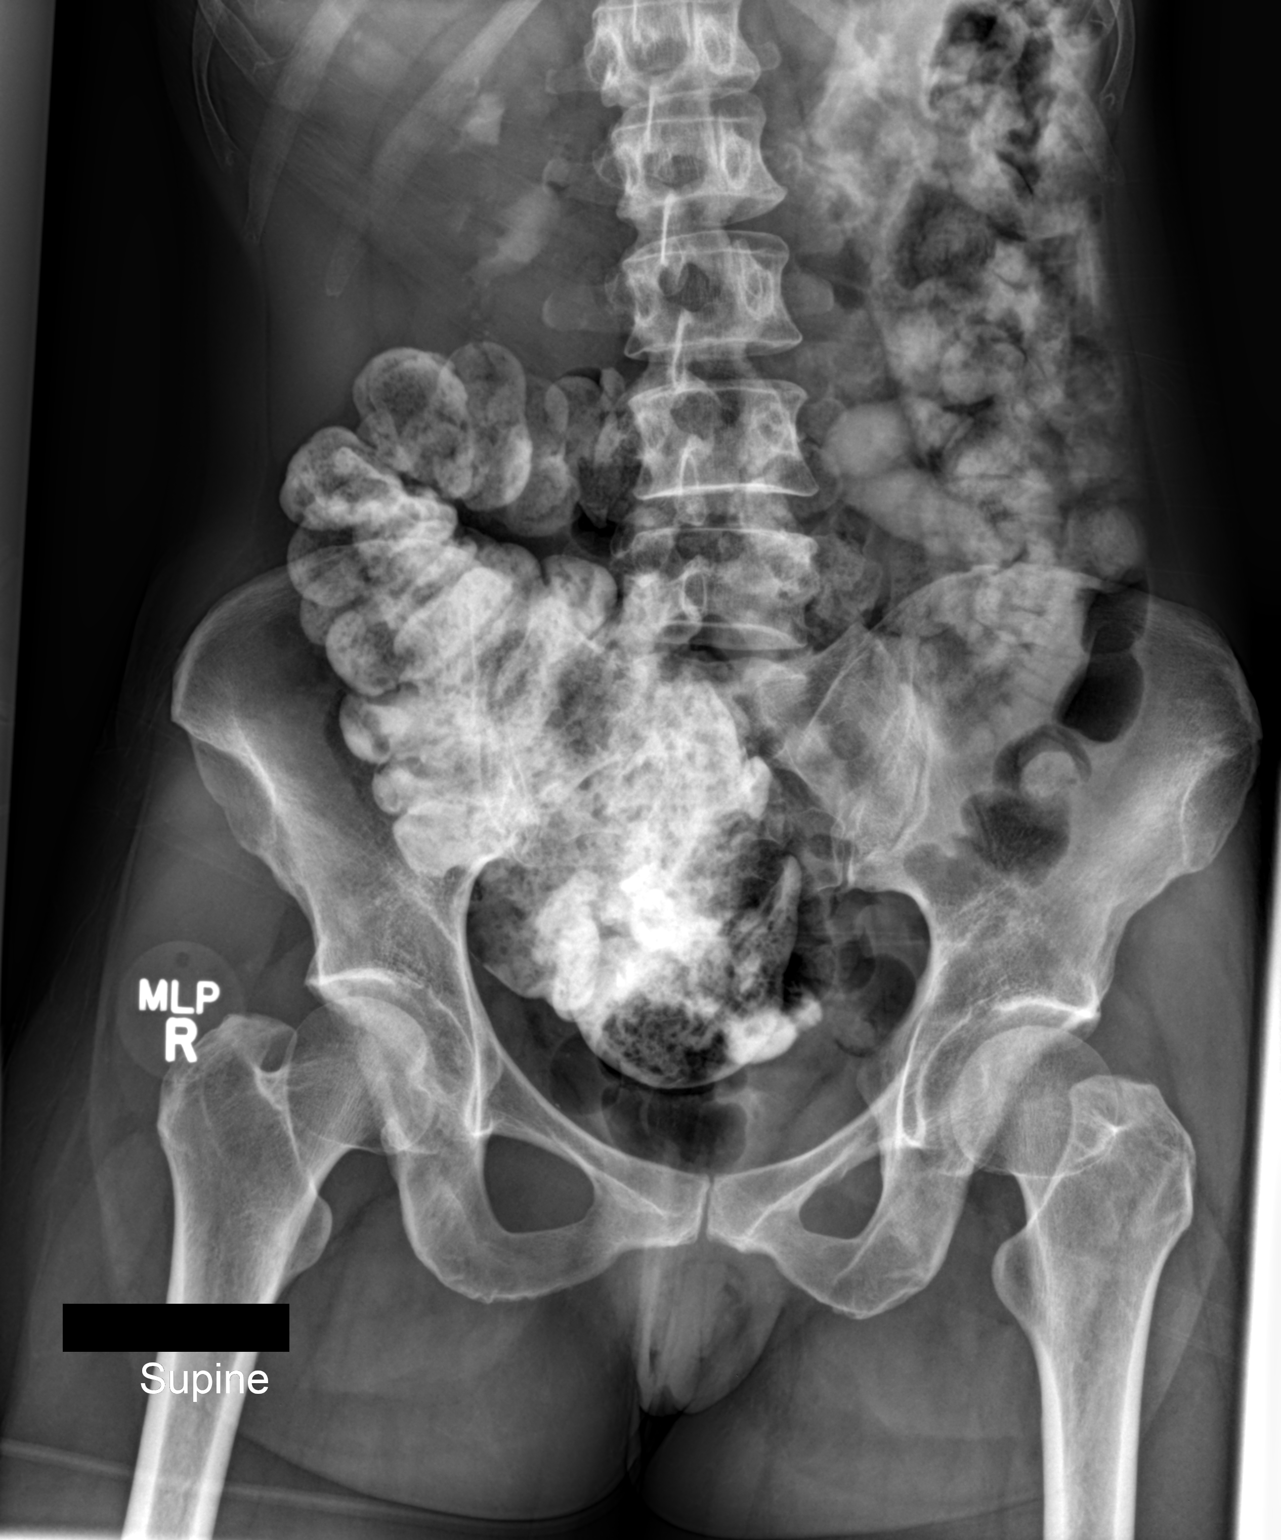

[2 of 2 positions shown; findings below may reference images not displayed]

FINDINGS: Oral contrast is present in the stomach, small bowel and
the cecum is distended with oral contrast.  There is no radiopaque
foreign body identified in the field.  Lucency is present in the
upper abdomen, expected in the setting of recent operation.
Excreted contrast is present in the renal collecting systems.
IMPRESSION: No radiopaque foreign body or retained surgical sponge.  This
finding was relayed to the technologist SEPAHI, LEVANDULE This
will subsequently be relayed to Dr. SAULOSI, BAIPOLEDI

## 2018-01-04 ENCOUNTER — Encounter: Payer: Self-pay | Admitting: Physical Therapy

## 2018-01-04 ENCOUNTER — Ambulatory Visit: Payer: BLUE CROSS/BLUE SHIELD | Attending: Family Medicine | Admitting: Physical Therapy

## 2018-01-04 ENCOUNTER — Other Ambulatory Visit: Payer: Self-pay

## 2018-01-04 DIAGNOSIS — R42 Dizziness and giddiness: Secondary | ICD-10-CM | POA: Diagnosis not present

## 2018-01-04 NOTE — Therapy (Signed)
[place] MAIN [facility] SERVICES [address] [place], [place], [address] Phone: [phone]   Fax:  [phone]  Physical Therapy Evaluation  Patient Details  Name: Sheila Whitney MRN: [id number] Date of Birth: 12-Apr-1966 No Data Recorded  Encounter Date: 01/04/2018  PT End of Session - 01/04/18 1428    Visit Number  1    Number of Visits  8    Date for PT Re-Evaluation  03/01/18    PT Start Time  3299    PT Stop Time  1527    PT Time Calculation (min)  58 min    Equipment Utilized During Treatment  Gait belt    Activity Tolerance  Patient tolerated treatment well    Behavior During Therapy  WFL for tasks assessed/performed       Past Medical History:  Diagnosis Date  . Cancer ([place])    Breast  . Renal disorder    kidney stones, bladder problems    Past Surgical History:  Procedure Laterality Date  . ABDOMINAL HYSTERECTOMY    . ABDOMINOPLASTY    . BREAST LUMPECTOMY     left breast  . CESAREAN SECTION    . LAPAROSCOPIC APPENDECTOMY N/A 03/02/2013   Procedure: APPENDECTOMY LAPAROSCOPIC ;  Surgeon: Adin Hector, MD;  Location: [facility];  Service: General;  Laterality: N/A;    There were no vitals filed for this visit.   Subjective Assessment - 01/16/18 1258    Subjective  Patient states that she feels better today than she has been feeling recently, but states that she is afraid the dizziness will come back.      Pertinent History  Patient reports she had sudden onset of dizziness about 4 weeks ago. Patient reports she did have an upper respiratory infection for about 6 weeks around Christmas time.     Diagnostic tests  none    Patient Stated Goals  patient would like to have decreased dizziness and be able to perform her daily tasks without dizziness and imbalance    Currently in Pain?  -- none stated         [facility] PT Assessment - 01/04/18 0001      Standardized Balance Assessment   Standardized Balance Assessment  Dynamic  Gait Index      Dynamic Gait Index   Level Surface  Normal 12 sec    Change in Gait Speed  Normal    Gait with Horizontal Head Turns  Mild Impairment    Gait with Vertical Head Turns  Mild Impairment    Gait and Pivot Turn  Mild Impairment right turn dizzy    Step Over Obstacle  Normal    Step Around Obstacles  Normal    Steps  Mild Impairment    Total Score  20          VESTIBULAR AND BALANCE EVALUATION  HISTORY:   Subjective history of current problem: Patient reports she had sudden onset of dizziness about 4 weeks ago. Patient reports she did have an upper respiratory infection for about 6 weeks around Christmas time.  Patient reports her dizziness now is better than it was at first. Patient states the first 2 weeks were really bad. Patient states she felt like she was on a boat at first and she had significant difficulty walking. Patient states she was crawling around the house. Patient states she started to do a few exercises describes Brandt-Daroff exercise, off the Internet. Patient states she had a few  days without dizziness and then it came back. Patient states the dizziness is brought on by lying down, turning head a certain way, moving her eyes, rolling over in bed and quick movements. Patient states when she walks she gets a sensation of swishing in her ears like she is underwater. Patient states she feels likes she is on a boat at times. Patient reports vertigo, unsteadiness and aural fullness. Patient states she is getting multiple bouts a day brought on by motion,intermittent and positional changes. 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 Patient states she will bend over to plant and will get dizzy and continue to fall forward.   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     EXAMINATION  SOMATOSENSORY:  Any N & T in extremities or weakness:  denies      COORDINATION: Finger to Nose:   Normal Past Pointing:  Normal  MUSCULOSKELETAL SCREEN: Cervical Spine ROM: AROM cervical spine flexion,extension, left/right rotation WFL without discomfort.   Functional Mobility: Patient performs sit to stand independently but does appear to have to stay stationary for a few seconds upon standing before initiating ambulation secondary to imbalance/dizziness.   Gait: Patient arrives to clinic ambulating without AD. Patient ambulates with decreased cadence with decreased arm swing and decreased visual scanning of environment. Patient holds her arms out at waist level when turning at times and appears mildly unsteady at times during DGI testing with gait. Scanning of visual environment with gait is: decreased/fair  Balance: Patient demonstrates difficulty with ambulation with head and body turns as well as narrow BOS, eyes closed and compliant surfaces.  POSTURAL CONTROL  TESTS:   Clinical Test of Sensory Interaction for Balance (CTSIB):  CONDITION TIME STRATEGY SWAY  Eyes open, firm surface 30 seconds  +1  Eyes closed, firm surface 30 seconds ankle +2  Eyes open, foam surface 30 seconds ankle +2  Eyes closed, foam surface 30 seconds Ankle, hip +3    OCULOMOTOR / VESTIBULAR TESTING:  Oculomotor Exam- Room Light  Normal Abnormal Comments  Ocular Alignment N    Ocular ROM N    Spontaneous Nystagmus N    End-Gaze Nystagmus N    Smooth Pursuit  ABN Noted left beating nystagmus with left field gaze; patient reports nausea with smooth pursuit testing  Saccades  ABN Hypometric saccades left, right and upper fields  VOR  ABN Reports dizziness  VOR Cancellation N  Denies blurring  Left Head Thrust N    Right Head Thrust  ABN Corrective saccade  Head Shaking Nystagmus N  No nystagmus observed; mild dizziness     BPPV TESTS:  Symptoms Duration Intensity Nystagmus  L Dix-Hallpike   2/10 Right beating nystagmus (ageotrophic) no upbeating or downbeating component; repeated test and no nystagmus was observed and patient denied vertigo  R Dix-Hallpike   0/10 none  L Head Roll   0/10 none  R Head Roll   0/10 none    FUNCTIONAL OUTCOME MEASURES:  Results Comments  DHI 64/100 Moderate perception of handicap; in need of intervention  ABC Scale 56.3% Falls risk; in need of intervention  DGI 20/24 Falls risk; in need of intervention   Neuromuscular Re-education: VOR X 1 exercise:  Demonstrated and educated as to VOR X1.  Patient performed VOR X 1 horizontal in sitting 2 reps of 30 seconds and 1 rep of 1 minute each with verbal cues for technique.  Patient reports unsteadiness feeling and 2 /10 dizziness after 30 seconds and 5/10 dizziness after 1 minute rep.  Issued VOR x1 for home exercise program.   PT Education - 01/04/18 1427    Education provided  Yes    Education Details  VOR exercise for HEP; discussed POC     Person(s) Educated  Patient     Methods  Explanation;Demonstration;Handout    Comprehension  Verbalized understanding       PT Short Term Goals - 01/04/18 1613      PT SHORT TERM GOAL #1   Title  Patient will be independent with home exercise program for self-management.    Time  8    Period  Weeks    Status  New    Target Date  03/01/18        PT Long Term Goals - 01/04/18 1613      PT LONG TERM GOAL #1   Title  Patient will reduce falls risk as indicated by Activities Specific Balance Confidence Scale (ABC) >67%.    Baseline  scored 56.2% on 01/04/2018    Time  8    Period  Weeks    Status  New    Target Date  03/01/18      PT LONG TERM GOAL #2   Title  Patient will reduce perceived disability to low levels as indicated by <40 on Dizziness Handicap Inventory to demonstrate significant improvement in dizziness.    Baseline  scored 64/100 on 01/04/2018    Time  8    Period  Weeks    Status  New    Target Date  03/01/18      PT LONG TERM GOAL #3   Title  Patient will demonstrate reduced falls risk as evidenced by Dynamic Gait Index (DGI) >21/24.    Baseline  scored 20/24 on 01/04/2018    Time  8    Period  Weeks    Status  New    Target Date  03/01/18      PT LONG TERM GOAL #4   Title  Patient will report 50% or greater improvement in her symptoms of dizziness and imbalance with provoking motions or positions.    Time  8    Period  Weeks    Status  New    Target Date  03/01/18             Plan - 01/16/18 1309    Clinical Impression Statement  Patient reports sudden onset of vertiginous symptoms about 4 weeks ago and reports that she recently had an  upper respiratory infection that lasted about 6 weeks in December 2018. Patient reports symptoms have been gradually improving but states that she continues to have vertigo, dizziness, unsteadiness and imbalance that is impacting her daily. Patient noted to have potential indicators of both central and peripheral vestibular signs with oculomotor  testing. Patient with positive right head thrust test. In addition, in teh left Dix-Hallpike position noted right beating nystagmus iwthout a down or upbeating component with patient reporting 2/10 dizziness. Repeated teh test and no further nystagmus was observed. Will plan on repeating Dix-Hallpike testing next session and performing canalith repositioning maneuvers if indicated. Patient scored moderate perception of handicap 64/100 on teh [place]. Patient scored 56% on teh ABC scale and 20/24 on the DGI. Patient demonstrates difficulty wtih ambulation with head and body turns, balance on compliant surfaces, narrow base of support and eyes closed activities. Patient would benefit from PT services to try to improve her balance and decrease falls risk, to try to address functional defictis identified and to try to decrease dizziness and vertigo symptoms.    Rehab Potential  Good    Clinical Impairments Affecting Rehab Potential  positive indicator: motivated, acute   negative indicator: history of breast cancer    PT Frequency  1x / week    PT Duration  8 weeks    PT Treatment/Interventions  Vestibular;Canalith Repostioning;Stair training;Gait training;Therapeutic exercise;Balance training;Neuromuscular re-education;Patient/family education;Therapeutic activities    PT Next Visit Plan  retest Dix-Hallpike test, VOR-1 exercise, activities with head turns and airex pad    PT Home Exercise Plan  VOR x1 in sitting 1 minute reps    Consulted and Agree with Plan of Care  Patient       Patient will benefit from skilled therapeutic intervention in order to improve the following deficits and impairments:  Decreased balance, Dizziness, Difficulty walking  Visit Diagnosis: Dizziness and giddiness     Problem List Patient Active Problem List   Diagnosis Date Noted  . Breast cancer ([place]) 03/02/2013  . Acute appendicitis 03/02/2013   Lady Deutscher PT, DPT [phone] Lady Deutscher 01/04/2018, 4:40 PM  [place] [facility] [address] [place], [place], [address] Phone: [phone]   Fax:  [phone]  Name: Jacqeline Broers MRN: [id number] Date of Birth: 11-16-1965

## 2018-01-17 ENCOUNTER — Encounter: Payer: Managed Care, Other (non HMO) | Admitting: Physical Therapy

## 2018-02-03 ENCOUNTER — Ambulatory Visit: Payer: BLUE CROSS/BLUE SHIELD | Attending: Family Medicine | Admitting: Physical Therapy

## 2018-11-08 ENCOUNTER — Ambulatory Visit: Payer: Managed Care, Other (non HMO) | Admitting: Gastroenterology

## 2019-05-23 ENCOUNTER — Ambulatory Visit
Admission: EM | Admit: 2019-05-23 | Discharge: 2019-05-23 | Disposition: A | Discharge status: Other Acute Inpatient Hospital | Payer: Managed Care, Other (non HMO) | Attending: Family Medicine | Admitting: Family Medicine

## 2019-05-23 ENCOUNTER — Other Ambulatory Visit: Payer: Self-pay

## 2019-05-23 ENCOUNTER — Encounter: Payer: Self-pay | Admitting: Emergency Medicine

## 2019-05-23 DIAGNOSIS — R51 Headache: Secondary | ICD-10-CM

## 2019-05-23 DIAGNOSIS — R519 Headache, unspecified: Secondary | ICD-10-CM

## 2019-05-23 DIAGNOSIS — R000 Tachycardia, unspecified: Secondary | ICD-10-CM | POA: Insufficient documentation

## 2019-05-23 DIAGNOSIS — E039 Hypothyroidism, unspecified: Secondary | ICD-10-CM | POA: Diagnosis not present

## 2019-05-23 DIAGNOSIS — R030 Elevated blood-pressure reading, without diagnosis of hypertension: Secondary | ICD-10-CM

## 2019-05-23 HISTORY — DX: Other specified behavioral and emotional disorders with onset usually occurring in childhood and adolescence: F98.8

## 2019-05-23 NOTE — ED Provider Notes (Addendum)
[facility]    CSN: [id number] Arrival date & time: 05/23/19  1207     History   Chief Complaint Chief Complaint  Patient presents with  . Hypertension  . Headache    HPI Sheila Whitney is a 53 y.o. female.   53 yo female with a c/o throbbing headaches on the back of her head, elevated blood pressures and heart racing for the past 4-5 days. States she has a h/o migraines but this headache feels different. States she also tried her Imitrex without relief. Also has been taking sudafed along with prescriptions for adderall (for add) and cytomel (for hypothyroidism). Denies any chest pains, shortness of breath, fevers, chills, cough. States her head has felt kind of "foggy" with difficulty concentrating and has felt dizzy.      Past Medical History:  Diagnosis Date  . ADD (attention deficit disorder)   . Cancer ([place])    Breast  . Renal disorder    kidney stones, bladder problems    Patient Active Problem List   Diagnosis Date Noted  . Breast cancer ([place]) 03/02/2013  . Acute appendicitis 03/02/2013    Past Surgical History:  Procedure Laterality Date  . ABDOMINAL HYSTERECTOMY    . ABDOMINOPLASTY    . BREAST LUMPECTOMY     left breast  . CESAREAN SECTION    . LAPAROSCOPIC APPENDECTOMY N/A 03/02/2013   Procedure: APPENDECTOMY LAPAROSCOPIC ;  Surgeon: Adin Hector, MD;  Location: [facility];  Service: General;  Laterality: N/A;    OB History   No obstetric history on file.      Home Medications    Prior to Admission medications   Medication Sig Start Date End Date Taking? Authorizing Provider  amphetamine-dextroamphetamine (ADDERALL) 15 MG tablet Take 15 mg by mouth 2 (two) times daily.   Yes [provider]  cyclobenzaprine (FLEXERIL) 10 MG tablet  06/28/13  Yes [provider]  liothyronine (CYTOMEL) 5 MCG tablet Take 5 mcg by mouth daily.   Yes [provider]  omega-3 acid ethyl esters (LOVAZA) 1 G capsule Take 2 g by mouth  daily.   Yes [provider]  SUMAtriptan (IMITREX) 100 MG tablet Take by mouth. 12/31/12  Yes [provider]  temazepam (RESTORIL) 22.5 MG capsule Take by mouth. 05/21/19  Yes [provider]  temazepam (RESTORIL) 30 MG capsule Alternate between 30 mg and 22.5 mg dose every other night for 4 weeks 04/30/13  Yes [provider]  anastrozole (ARIMIDEX) 1 MG tablet Take 1 mg by mouth daily.    [provider]  calcium carbonate (OS-CAL) 600 MG TABS Take 1,200 mg by mouth daily.    [provider]  hydrochlorothiazide (MICROZIDE) 12.5 MG capsule Take 12.5 mg by mouth daily.    [provider]  HYDROcodone-acetaminophen (NORCO/VICODIN) 5-325 MG per tablet Take 1-2 tablets by mouth every 4 (four) hours as needed. Patient not taking: Reported on 01/04/2018 03/03/13   Johnathan Hausen, MD    Family History History reviewed. No pertinent family history.  Social History Social History   Tobacco Use  . Smoking status: Former Research scientist (life sciences)  . Smokeless tobacco: Never Used  Substance Use Topics  . Alcohol use: Yes    Alcohol/week: 2.0 standard drinks    Types: 2 Glasses of wine per week    Comment: 2 glasses of wine 2x week  . Drug use: No     Allergies   Patient has no known allergies.   Review of  Systems Review of Systems   Physical Exam Triage Vital Signs ED Triage Vitals  Enc Vitals Group     BP 05/23/19 1230 (!) 134/104     Pulse Rate 05/23/19 1230 (!) 140     Resp 05/23/19 1230 18     Temp 05/23/19 1230 98.5 F (36.9 C)     Temp Source 05/23/19 1230 Oral     SpO2 05/23/19 1230 100 %     Weight 05/23/19 1229 112 lb (50.8 kg)     Height 05/23/19 1229 5\' 2"  (1.575 m)     Head Circumference --      Peak Flow --      Pain Score 05/23/19 1229 6     Pain Loc --      Pain Edu? --      Excl. in [place]? --    No data found.  Updated Vital Signs BP (!) 134/104 (BP Location: Right Arm)   Pulse (!) 140   Temp 98.5 F (36.9 C)  (Oral)   Resp 18   Ht 5\' 2"  (1.575 m)   Wt 50.8 kg   SpO2 100%   BMI 20.49 kg/m   Visual Acuity Right Eye Distance:   Left Eye Distance:   Bilateral Distance:    Right Eye Near:   Left Eye Near:    Bilateral Near:     Physical Exam Vitals signs reviewed.  Constitutional:      General: She is not in acute distress.    Appearance: She is not toxic-appearing or diaphoretic.  Cardiovascular:     Rate and Rhythm: Regular rhythm. Tachycardia present.  Pulmonary:     Effort: Pulmonary effort is normal. No respiratory distress.     Breath sounds: Normal breath sounds. No stridor. No wheezing, rhonchi or rales.  Neurological:     General: No focal deficit present.     Mental Status: She is alert and oriented to person, place, and time.      UC Treatments / Results  Labs (all labs ordered are listed, but only abnormal results are displayed) Labs Reviewed - No data to display  EKG   Radiology No results found.  Procedures ED EKG  Date/Time: 05/23/2019 3:28 PM Performed by: Norval Gable, MD Authorized by: Norval Gable, MD   ECG reviewed by ED Physician in the absence of a cardiologist: yes   Previous ECG:    Previous ECG:  Unavailable Interpretation:    Interpretation: abnormal   Rate:    ECG rate:  127   ECG rate assessment: tachycardic   Rhythm:    Rhythm: sinus tachycardia   Ectopy:    Ectopy: none   QRS:    QRS axis:  Normal Conduction:    Conduction: normal   ST segments:    ST segments:  Normal T waves:    T waves: normal     (including critical care time)  Medications Ordered in UC Medications - No data to display  Initial Impression / Assessment and Plan / UC Course  I have reviewed the triage vital signs and the nursing notes.  Pertinent labs & imaging results that were available during my care of the patient were reviewed by me and considered in my medical decision making (see chart for details).      Final Clinical Impressions(s)  / UC Diagnoses   Final diagnoses:  Throbbing headache  Sinus tachycardia  Elevated blood pressure reading  Hypothyroidism, unspecified type     Discharge Instructions  Recommend patient go to Emergency Department for further evaluation and management    ED Prescriptions    None      1. diagnosis and possible etiologies reviewed with patient; recommend patient go to Emergency Department for further evaluation and management; patient verbalizes understanding and will have boyfriend drive her by private vehicle.   Controlled Substance Prescriptions [place] Controlled Substance Registry consulted? Not Applicable   Norval Gable, MD 05/23/19 [place], [place], MD 05/23/19 [phone]

## 2019-05-23 NOTE — ED Triage Notes (Signed)
Patient states on Friday she started having a throbbing headache when she gets up or with movement. Patient also reports elevated BP. She states she checked her BP this morning and it was 135/101. She has been taking her Imitrex and sinus congestion. She does report jaw and neck pain but is unsure of when this started.

## 2019-05-23 NOTE — Discharge Instructions (Signed)
Recommend patient go to Emergency Department for further evaluation and management °

## 2019-07-16 ENCOUNTER — Other Ambulatory Visit: Payer: Self-pay | Admitting: Physician Assistant

## 2019-07-16 DIAGNOSIS — Z1231 Encounter for screening mammogram for malignant neoplasm of breast: Secondary | ICD-10-CM

## 2019-07-26 ENCOUNTER — Inpatient Hospital Stay: Admission: RE | Admit: 2019-07-26 | Payer: Managed Care, Other (non HMO) | Source: Ambulatory Visit

## 2021-03-06 ENCOUNTER — Other Ambulatory Visit: Payer: Self-pay

## 2021-03-06 ENCOUNTER — Ambulatory Visit
Admission: EM | Admit: 2021-03-06 | Discharge: 2021-03-06 | Disposition: A | Discharge status: Home / Self Care | Payer: Managed Care, Other (non HMO) | Attending: Sports Medicine | Admitting: Sports Medicine

## 2021-03-06 ENCOUNTER — Encounter: Payer: Self-pay | Admitting: Emergency Medicine

## 2021-03-06 DIAGNOSIS — R350 Frequency of micturition: Secondary | ICD-10-CM

## 2021-03-06 DIAGNOSIS — R300 Dysuria: Secondary | ICD-10-CM | POA: Diagnosis not present

## 2021-03-06 DIAGNOSIS — R102 Pelvic and perineal pain: Secondary | ICD-10-CM | POA: Diagnosis not present

## 2021-03-06 DIAGNOSIS — R3915 Urgency of urination: Secondary | ICD-10-CM | POA: Diagnosis not present

## 2021-03-06 LAB — URINALYSIS, COMPLETE (UACMP) WITH MICROSCOPIC
Bilirubin Urine: NEGATIVE
Glucose, UA: NEGATIVE mg/dL
Hgb urine dipstick: NEGATIVE
Ketones, ur: NEGATIVE mg/dL
Leukocytes,Ua: NEGATIVE
Nitrite: NEGATIVE
Protein, ur: NEGATIVE mg/dL
Specific Gravity, Urine: 1.01 (ref 1.005–1.030)
pH: 7 (ref 5.0–8.0)

## 2021-03-06 MED ORDER — PHENAZOPYRIDINE HCL 200 MG PO TABS: 200.0000 mg | ORAL_TABLET | Freq: Three times a day (TID) | ORAL | 0 refills | Status: AC

## 2021-03-06 MED ORDER — URO-MP 118 MG PO CAPS: 1.0000 | ORAL_CAPSULE | Freq: Four times a day (QID) | ORAL | 0 refills | Status: AC | PRN

## 2021-03-06 NOTE — ED Triage Notes (Signed)
Patient c/o burning when urinating and urinary frequency that started on Sunday.  Patient states that she took AZO for the first couple of days.  Patient reports history for UTIs and kidney stones.

## 2021-03-06 NOTE — Discharge Instructions (Signed)
Discussed, your urine does not show a UTI. I did prescribe the medication you URO-MP that you wanted to take.  I also prescribed Pyridium.  You get both of them at your pharmacy. Please see educational handouts. Plenty of rest plenty of fluids, Tylenol or Motrin for any fever or discomfort. I gave you the name of a local urologist.  Please call them.  There is a condition called interstitial cystitis that you may have but that is a diagnosis that needs to be made by urology. Also put in a referral to help you find a new primary care physician. If your symptoms worsen or you develop any concern for infection or fever or back pain then please go to the emergency room.

## 2021-03-06 NOTE — ED Provider Notes (Signed)
[facility]    CSN: [id number] Arrival date & time: 03/06/21  0946      History   Chief Complaint Chief Complaint  Patient presents with  . Dysuria  . Urinary Frequency    HPI Sheila Whitney is a 55 y.o. female.   55 year old female who presents for evaluation of the above issue.  She have a primary care physician through the [facility] system but has recently relocated to the area and is looking for a new primary care provider as this one is too far away.  She has a remote history of UTIs and urosepsis requiring admission to the hospital back in 2015.  She did have a urologist but has not seen them in years.  She is looking to reestablish care given her current symptoms.  She wants a urologist in the area.  She is complaining of 5 to 6 days of dysuria, increased urinary frequency, and increased urgency.  She has taken Azo with limited success.  No fever shakes chills.  She does have some suprapubic pressure.  No flank pain.  No nausea or diarrhea.  No chest pain shortness of breath.  No red flag signs or symptoms elicited on history.     Past Medical History:  Diagnosis Date  . ADD (attention deficit disorder)   . Cancer ([place])    Breast  . Renal disorder    kidney stones, bladder problems    Patient Active Problem List   Diagnosis Date Noted  . Breast cancer ([place]) 03/02/2013  . Acute appendicitis 03/02/2013    Past Surgical History:  Procedure Laterality Date  . ABDOMINAL HYSTERECTOMY    . ABDOMINOPLASTY    . BREAST LUMPECTOMY     left breast  . CESAREAN SECTION    . LAPAROSCOPIC APPENDECTOMY N/A 03/02/2013   Procedure: APPENDECTOMY LAPAROSCOPIC ;  Surgeon: Adin Hector, MD;  Location: [facility];  Service: General;  Laterality: N/A;    OB History   No obstetric history on file.      Home Medications    Prior to Admission medications   Medication Sig Start Date End Date Taking? Authorizing Provider  cyclobenzaprine (FLEXERIL) 10 MG tablet  06/28/13  Yes  [provider]  Meth-Hyo-M Bl-Na Phos-Ph Sal (URO-MP) 118 MG CAPS Take 1 capsule by mouth 4 (four) times daily as needed. 03/06/21  Yes Verda Cumins, MD  phenazopyridine (PYRIDIUM) 200 MG tablet Take 1 tablet (200 mg total) by mouth 3 (three) times daily. 03/06/21  Yes Verda Cumins, MD  SUMAtriptan (IMITREX) 100 MG tablet Take by mouth. 12/31/12  Yes [provider]  temazepam (RESTORIL) 30 MG capsule 15 mg at bedtime as needed. 04/30/13  Yes [provider]  amphetamine-dextroamphetamine (ADDERALL) 15 MG tablet Take 15 mg by mouth 2 (two) times daily.    [provider]  anastrozole (ARIMIDEX) 1 MG tablet Take 1 mg by mouth daily.    [provider]  calcium carbonate (OS-CAL) 600 MG TABS Take 1,200 mg by mouth daily.    [provider]  hydrochlorothiazide (MICROZIDE) 12.5 MG capsule Take 12.5 mg by mouth daily.    [provider]  HYDROcodone-acetaminophen (NORCO/VICODIN) 5-325 MG per tablet Take 1-2 tablets by mouth every 4 (four) hours as needed. Patient not taking: No sig reported 03/03/13   Johnathan Hausen, MD  liothyronine (CYTOMEL) 5 MCG tablet Take 5 mcg by mouth daily.    [provider]  omega-3 acid ethyl esters (LOVAZA) 1 G capsule Take  2 g by mouth daily.    [provider]  temazepam (RESTORIL) 22.5 MG capsule Take by mouth. 05/21/19   [provider]    Family History History reviewed. No pertinent family history.  Social History Social History   Tobacco Use  . Smoking status: Former Research scientist (life sciences)  . Smokeless tobacco: Never Used  Vaping Use  . Vaping Use: Never used  Substance Use Topics  . Alcohol use: Yes    Alcohol/week: 2.0 standard drinks    Types: 2 Glasses of wine per week    Comment: 2 glasses of wine 2x week  . Drug use: No     Allergies   Patient has no known allergies.   Review of Systems Review of Systems  Constitutional: Negative.  Negative for activity change,  appetite change, chills, diaphoresis and fever.  HENT: Negative.  Negative for congestion and sore throat.   Eyes: Negative.  Negative for pain.  Respiratory: Negative.  Negative for cough and shortness of breath.   Cardiovascular: Negative.  Negative for chest pain and palpitations.  Gastrointestinal: Positive for abdominal pain. Negative for diarrhea and nausea.  Genitourinary: Positive for dysuria, frequency and urgency. Negative for flank pain, hematuria, pelvic pain, vaginal bleeding, vaginal discharge and vaginal pain.  Musculoskeletal: Negative.  Negative for back pain, myalgias, neck pain and neck stiffness.  Skin: Negative.  Negative for color change, pallor and rash.  Neurological: Negative.  Negative for dizziness, syncope, weakness, light-headedness and headaches.  All other systems reviewed and are negative.    Physical Exam Triage Vital Signs ED Triage Vitals  Enc Vitals Group     BP 03/06/21 1002 (!) 140/92     Pulse Rate 03/06/21 1002 78     Resp 03/06/21 1002 14     Temp 03/06/21 1002 98.4 F (36.9 C)     Temp Source 03/06/21 1002 Oral     SpO2 03/06/21 1002 99 %     Weight 03/06/21 0959 118 lb (53.5 kg)     Height 03/06/21 0959 5\' 3"  (1.6 m)     Head Circumference --      Peak Flow --      Pain Score 03/06/21 0959 3     Pain Loc --      Pain Edu? --      Excl. in [place]? --    No data found.  Updated Vital Signs BP (!) 140/92 (BP Location: Right Arm)   Pulse 78   Temp 98.4 F (36.9 C) (Oral)   Resp 14   Ht 5\' 3"  (1.6 m)   Wt 53.5 kg   SpO2 99%   BMI 20.90 kg/m   Visual Acuity Right Eye Distance:   Left Eye Distance:   Bilateral Distance:    Right Eye Near:   Left Eye Near:    Bilateral Near:     Physical Exam Vitals and nursing note reviewed.  Constitutional:      General: She is not in acute distress.    Appearance: Normal appearance. She is not ill-appearing, toxic-appearing or diaphoretic.  HENT:     Head: Normocephalic and atraumatic.   Eyes:     Extraocular Movements: Extraocular movements intact.     Conjunctiva/sclera: Conjunctivae normal.     Pupils: Pupils are equal, round, and reactive to light.  Cardiovascular:     Rate and Rhythm: Normal rate and regular rhythm.     Pulses: Normal pulses.     Heart sounds: Normal heart sounds. No murmur  heard. No friction rub. No gallop.   Pulmonary:     Effort: Pulmonary effort is normal. No respiratory distress.     Breath sounds: Normal breath sounds. No stridor. No wheezing, rhonchi or rales.  Abdominal:     General: Abdomen is flat. Bowel sounds are normal. There is no distension. There are no signs of injury.     Palpations: Abdomen is soft. There is no shifting dullness, fluid wave, hepatomegaly or splenomegaly.     Tenderness: There is abdominal tenderness in the suprapubic area. There is no right CVA tenderness, guarding or rebound. Negative signs include Murphy's sign, Rovsing's sign, McBurney's sign, psoas sign and obturator sign.  Musculoskeletal:     Cervical back: Normal range of motion and neck supple. No rigidity.  Skin:    General: Skin is warm and dry.     Capillary Refill: Capillary refill takes less than 2 seconds.     Findings: No bruising, erythema, lesion or rash.  Neurological:     General: No focal deficit present.     Mental Status: She is alert and oriented to person, place, and time.      UC Treatments / Results  Labs (all labs ordered are listed, but only abnormal results are displayed) Labs Reviewed  URINALYSIS, COMPLETE (UACMP) WITH MICROSCOPIC - Abnormal; Notable for the following components:      Result Value   Bacteria, UA RARE (*)    All other components within normal limits    EKG   Radiology No results found.  Procedures Procedures (including critical care time)  Medications Ordered in UC Medications - No data to display  Initial Impression / Assessment and Plan / UC Course  I have reviewed the triage vital signs and  the nursing notes.  Pertinent labs & imaging results that were available during my care of the patient were reviewed by me and considered in my medical decision making (see chart for details).   Clinical impression: 5 to 6 days of dysuria, increased urgency, increased frequency, with suprapubic abdominal pain.  She does have a remote history of UTIs, urosepsis, and a history of kidney stones.  Treatment plan: 1.  The findings and treatment plan were discussed in detail with the patient.  Patient was in agreement. 2.  We did a UA.  It was negative.  No bacteria, nitrites, leukocytes, or WBCs. 3.  Prescribed uro-MP and Pyridium.  Sent into her pharmacy. 4.  Educational handouts. 5.  Plenty of rest, plenty fluids, Tylenol or Motrin for any fever or discomfort. 6.  She may well have interstitial cystitis and also can a send her off to urology.  I did give her some information on that but I was clear that she does not have that diagnosis and that needs to come from urology. 7.  Put in a request for assistance to provide help with finding a primary care physician locally. 8.  If symptoms worsen in any way she should go to the ER. 9.  Discharge from care in stable condition and follow-up here as needed.    Final Clinical Impressions(s) / UC Diagnoses   Final diagnoses:  Dysuria  Urinary frequency  Urinary urgency  Suprapubic abdominal pain     Discharge Instructions     Discussed, your urine does not show a UTI. I did prescribe the medication you URO-MP that you wanted to take.  I also prescribed Pyridium.  You get both of them at your pharmacy. Please see educational handouts. [employer]  of rest plenty of fluids, Tylenol or Motrin for any fever or discomfort. I gave you the name of a local urologist.  Please call them.  There is a condition called interstitial cystitis that you may have but that is a diagnosis that needs to be made by urology. Also put in a referral to help you find a new  primary care physician. If your symptoms worsen or you develop any concern for infection or fever or back pain then please go to the emergency room.    ED Prescriptions    Medication Sig Dispense Auth. Provider   phenazopyridine (PYRIDIUM) 200 MG tablet Take 1 tablet (200 mg total) by mouth 3 (three) times daily. 6 tablet Verda Cumins, MD   Meth-Hyo-M [place] Phos-Ph Sal (URO-MP) 118 MG CAPS Take 1 capsule by mouth 4 (four) times daily as needed. 30 capsule Verda Cumins, MD     PDMP not reviewed this encounter.   Verda Cumins, MD 03/06/21 1057

## 2021-03-31 ENCOUNTER — Ambulatory Visit: Payer: Self-pay | Admitting: Internal Medicine

## 2021-10-12 ENCOUNTER — Other Ambulatory Visit: Payer: Self-pay

## 2021-10-12 ENCOUNTER — Ambulatory Visit
Admission: RE | Admit: 2021-10-12 | Discharge: 2021-10-12 | Disposition: A | Discharge status: Home / Self Care | Payer: Managed Care, Other (non HMO) | Source: Ambulatory Visit

## 2021-10-12 VITALS — BP 122/91 | HR 102 | Temp 99.1°F | Resp 16

## 2021-10-12 DIAGNOSIS — J069 Acute upper respiratory infection, unspecified: Secondary | ICD-10-CM | POA: Diagnosis not present

## 2021-10-12 HISTORY — DX: Hypothyroidism, unspecified: E03.9

## 2021-10-12 LAB — GROUP A STREP BY PCR: Group A Strep by PCR: NOT DETECTED

## 2021-10-12 MED ORDER — LIDOCAINE VISCOUS HCL 2 % MT SOLN: 15.0000 mL | OROMUCOSAL | 0 refills | Status: AC | PRN

## 2021-10-12 NOTE — ED Triage Notes (Signed)
Pt presents today with c/o of fever, sore throat and cough since Friday. Pain 8/10 (throat). She took both Tylenol and Motrin at approx 11am.

## 2021-10-12 NOTE — ED Provider Notes (Signed)
[facility]    CSN: [id number] Arrival date & time: 10/12/21  1205      History   Chief Complaint Chief Complaint  Patient presents with   Appt@12    Fever   Sore Throat   Cough    HPI Sheila Whitney is a 55 y.o. female.   Patient presents with fever, body aches, bilateral ear fullness, sore throat, nonproductive cough and intermittent generalized headaches for 4 days.  Tolerating food and liquids.  But painful to swallow.  Has attempted use motrin and tylenol which is helpful. No known sick contacts. History of breast cancer, hypothyroidism.  Denies rhinorrhea, shortness of breath, wheezing, abdominal pain, nausea, vomiting, diarrhea.  Past Medical History:  Diagnosis Date   ADD (attention deficit disorder)    Cancer ([place])    Breast   Hypothyroidism    Renal disorder    kidney stones, bladder problems    Patient Active Problem List   Diagnosis Date Noted   Breast cancer ([place]) 03/02/2013   Acute appendicitis 03/02/2013    Past Surgical History:  Procedure Laterality Date   ABDOMINAL HYSTERECTOMY     ABDOMINOPLASTY     BREAST LUMPECTOMY     left breast   CESAREAN SECTION     LAPAROSCOPIC APPENDECTOMY N/A 03/02/2013   Procedure: APPENDECTOMY LAPAROSCOPIC ;  Surgeon: Adin Hector, MD;  Location: [facility];  Service: General;  Laterality: N/A;    OB History   No obstetric history on file.      Home Medications    Prior to Admission medications   Medication Sig Start Date End Date Taking? Authorizing Provider  liothyronine (CYTOMEL) 5 MCG tablet Take 5 mcg by mouth daily.   Yes [provider]  liothyronine (CYTOMEL) 5 MCG tablet Take 2 tablets by mouth daily. 12/26/12  Yes [provider]  amphetamine-dextroamphetamine (ADDERALL) 15 MG tablet Take 15 mg by mouth 2 (two) times daily.    [provider]  anastrozole (ARIMIDEX) 1 MG tablet Take 1 mg by mouth daily.    [provider]  calcium carbonate (OS-CAL) 600  MG TABS Take 1,200 mg by mouth daily.    [provider]  cyclobenzaprine (FLEXERIL) 10 MG tablet  06/28/13   [provider]  hydrochlorothiazide (MICROZIDE) 12.5 MG capsule Take 12.5 mg by mouth daily.    [provider]  HYDROcodone-acetaminophen (NORCO/VICODIN) 5-325 MG per tablet Take 1-2 tablets by mouth every 4 (four) hours as needed. Patient not taking: No sig reported 03/03/13   Johnathan Hausen, MD  Meth-Hyo-M Bl-Na Phos-Ph Sal (URO-MP) 118 MG CAPS Take 1 capsule by mouth 4 (four) times daily as needed. 03/06/21   Verda Cumins, MD  omega-3 acid ethyl esters (LOVAZA) 1 G capsule Take 2 g by mouth daily.    [provider]  phenazopyridine (PYRIDIUM) 200 MG tablet Take 1 tablet (200 mg total) by mouth 3 (three) times daily. 03/06/21   Verda Cumins, MD  SUMAtriptan (IMITREX) 100 MG tablet Take by mouth. 12/31/12   [provider]  temazepam (RESTORIL) 22.5 MG capsule Take by mouth. 05/21/19   [provider]  temazepam (RESTORIL) 30 MG capsule 15 mg at bedtime as needed. 04/30/13   [provider]    Family History History reviewed. No pertinent family history.  Social History Social History   Tobacco Use   Smoking status: Former   Smokeless tobacco: Never  Scientific laboratory technician Use: Never used  Substance Use Topics  Alcohol use: Yes    Alcohol/week: 2.0 standard drinks    Types: 2 Glasses of wine per week    Comment: 2 glasses of wine 2x week   Drug use: No     Allergies   Patient has no known allergies.   Review of Systems Review of Systems  Constitutional:  Positive for fever. Negative for activity change, appetite change, chills, diaphoresis, fatigue and unexpected weight change.  HENT:  Positive for ear discharge and sore throat. Negative for congestion, dental problem, drooling, ear pain, facial swelling, hearing loss, mouth sores, nosebleeds, postnasal drip, rhinorrhea, sinus pressure, sinus pain, sneezing,  tinnitus, trouble swallowing and voice change.   Respiratory:  Positive for cough. Negative for apnea, choking, chest tightness, shortness of breath, wheezing and stridor.   Cardiovascular: Negative.   Gastrointestinal: Negative.   Skin: Negative.   Neurological:  Positive for headaches. Negative for dizziness, tremors, seizures, syncope, facial asymmetry, speech difficulty, weakness, light-headedness and numbness.    Physical Exam Triage Vital Signs ED Triage Vitals  Enc Vitals Group     BP 10/12/21 1220 (!) 122/91     Pulse Rate 10/12/21 1220 (!) 102     Resp 10/12/21 1220 16     Temp 10/12/21 1220 99.1 F (37.3 C)     Temp Source 10/12/21 1220 Oral     SpO2 10/12/21 1220 99 %     Weight --      Height --      Head Circumference --      Peak Flow --      Pain Score 10/12/21 1214 8     Pain Loc --      Pain Edu? --      Excl. in [place]? --    No data found.  Updated Vital Signs BP (!) 122/91 (BP Location: Left Arm)    Pulse (!) 102    Temp 99.1 F (37.3 C) (Oral)    Resp 16    SpO2 99%   Visual Acuity Right Eye Distance:   Left Eye Distance:   Bilateral Distance:    Right Eye Near:   Left Eye Near:    Bilateral Near:     Physical Exam Constitutional:      Appearance: Normal appearance. She is normal weight.  HENT:     Head: Normocephalic.     Right Ear: Tympanic membrane, ear canal and external ear normal.     Left Ear: Tympanic membrane, ear canal and external ear normal.     Nose: Congestion and rhinorrhea present.     Mouth/Throat:     Mouth: Mucous membranes are moist.     Pharynx: Posterior oropharyngeal erythema present.     Tonsils: 2+ on the right. 2+ on the left.  Eyes:     Extraocular Movements: Extraocular movements intact.  Cardiovascular:     Rate and Rhythm: Normal rate and regular rhythm.     Pulses: Normal pulses.     Heart sounds: Normal heart sounds.  Pulmonary:     Effort: Pulmonary effort is normal.     Breath sounds: Normal breath  sounds.  Musculoskeletal:        General: Normal range of motion.     Cervical back: Normal range of motion.  Lymphadenopathy:     Cervical: Cervical adenopathy present.  Skin:    General: Skin is warm and dry.  Neurological:     Mental Status: She is alert and oriented to person, place, and time. Mental  status is at baseline.  Psychiatric:        Mood and Affect: Mood normal.        Behavior: Behavior normal.     UC Treatments / Results  Labs (all labs ordered are listed, but only abnormal results are displayed) Labs Reviewed  GROUP A STREP BY PCR    EKG   Radiology No results found.  Procedures Procedures (including critical care time)  Medications Ordered in UC Medications - No data to display  Initial Impression / Assessment and Plan / UC Course  I have reviewed the triage vital signs and the nursing notes.  Pertinent labs & imaging results that were available during my care of the patient were reviewed by me and considered in my medical decision making (see chart for details).  Viral URI with cough  1.  Strep PCR negative 2.  Lidocaine viscous 2% 15 mL every 4 hours as needed 3.  Advised continued use of ibuprofen and Tylenol, patient is already using maximum dosage 4.  Urgent care follow-up as needed Final Clinical Impressions(s) / UC Diagnoses   Final diagnoses:  None   Discharge Instructions   None    ED Prescriptions   None    PDMP not reviewed this encounter.   Hans Eden, NP 10/12/21 1336

## 2021-10-12 NOTE — Discharge Instructions (Signed)
Your symptoms today are most likely being caused by a virus and should steadily improve in time it can take up to 7 to 10 days before you truly start to see a turnaround however things will get better  May gargle and spit lidocaine solution every 4 hours for temporary relief of sore throat    You can take Tylenol and/or Ibuprofen as needed for fever reduction and pain relief.   For cough: honey 1/2 to 1 teaspoon (you can dilute the honey in water or another fluid).  You can also use guaifenesin and dextromethorphan for cough. You can use a humidifier for chest congestion and cough.  If you don't have a humidifier, you can sit in the bathroom with the hot shower running.      For sore throat: try warm salt water gargles, cepacol lozenges, throat spray, warm tea or water with lemon/honey, popsicles or ice, or OTC cold relief medicine for throat discomfort.   For congestion: take a daily anti-histamine like Zyrtec, Claritin, and a oral decongestant, such as pseudoephedrine.  You can also use Flonase 1-2 sprays in each nostril daily.   It is important to stay hydrated: drink plenty of fluids (water, gatorade/powerade/pedialyte, juices, or teas) to keep your throat moisturized and help further relieve irritation/discomfort.

## 2024-12-25 ENCOUNTER — Ambulatory Visit
